# Patient Record
Sex: Female | Born: 1960 | Race: Black or African American | Hispanic: No | Marital: Married
Health system: Southern US, Community
[De-identification: ages and names within clinical notes are randomized; demographics above are authoritative.]

## PROBLEM LIST (undated history)

## (undated) DIAGNOSIS — J309 Allergic rhinitis, unspecified: Secondary | ICD-10-CM

## (undated) DIAGNOSIS — E669 Obesity, unspecified: Secondary | ICD-10-CM

## (undated) DIAGNOSIS — I10 Essential (primary) hypertension: Secondary | ICD-10-CM

## (undated) HISTORY — DX: Allergic rhinitis, unspecified: J30.9

## (undated) HISTORY — DX: Essential (primary) hypertension: I10

## (undated) HISTORY — DX: Obesity, unspecified: E66.9

---

## 1998-04-17 ENCOUNTER — Other Ambulatory Visit: Admission: RE | Admit: 1998-04-17 | Discharge: 1998-04-17 | Payer: Self-pay | Admitting: Family Medicine

## 1999-01-19 ENCOUNTER — Emergency Department (HOSPITAL_COMMUNITY): Admission: EM | Admit: 1999-01-19 | Discharge: 1999-01-19 | Payer: Self-pay | Admitting: Emergency Medicine

## 1999-01-19 ENCOUNTER — Encounter: Payer: Self-pay | Admitting: Emergency Medicine

## 2001-10-11 HISTORY — PX: ECTOPIC PREGNANCY SURGERY: SHX613

## 2001-10-13 ENCOUNTER — Encounter: Payer: Self-pay | Admitting: Emergency Medicine

## 2001-10-13 ENCOUNTER — Emergency Department (HOSPITAL_COMMUNITY): Admission: EM | Admit: 2001-10-13 | Discharge: 2001-10-13 | Payer: Self-pay | Admitting: Emergency Medicine

## 2002-03-30 ENCOUNTER — Encounter (INDEPENDENT_AMBULATORY_CARE_PROVIDER_SITE_OTHER): Payer: Self-pay

## 2002-03-30 ENCOUNTER — Encounter: Payer: Self-pay | Admitting: *Deleted

## 2002-03-30 ENCOUNTER — Inpatient Hospital Stay (HOSPITAL_COMMUNITY): Admission: AD | Admit: 2002-03-30 | Discharge: 2002-04-01 | Payer: Self-pay | Admitting: *Deleted

## 2002-04-04 ENCOUNTER — Inpatient Hospital Stay (HOSPITAL_COMMUNITY): Admission: AD | Admit: 2002-04-04 | Discharge: 2002-04-04 | Payer: Self-pay | Admitting: *Deleted

## 2002-05-08 ENCOUNTER — Encounter: Admission: RE | Admit: 2002-05-08 | Discharge: 2002-05-08 | Payer: Self-pay | Admitting: *Deleted

## 2002-06-14 ENCOUNTER — Ambulatory Visit (HOSPITAL_COMMUNITY): Admission: RE | Admit: 2002-06-14 | Discharge: 2002-06-14 | Payer: Self-pay | Admitting: *Deleted

## 2002-07-03 ENCOUNTER — Encounter: Admission: RE | Admit: 2002-07-03 | Discharge: 2002-07-03 | Payer: Self-pay | Admitting: *Deleted

## 2002-07-17 ENCOUNTER — Encounter: Admission: RE | Admit: 2002-07-17 | Discharge: 2002-07-17 | Payer: Self-pay | Admitting: *Deleted

## 2002-07-31 ENCOUNTER — Encounter: Admission: RE | Admit: 2002-07-31 | Discharge: 2002-07-31 | Payer: Self-pay | Admitting: *Deleted

## 2002-11-23 ENCOUNTER — Encounter: Admission: RE | Admit: 2002-11-23 | Discharge: 2002-11-23 | Payer: Self-pay | Admitting: *Deleted

## 2003-02-15 ENCOUNTER — Encounter: Admission: RE | Admit: 2003-02-15 | Discharge: 2003-02-15 | Payer: Self-pay | Admitting: Family Medicine

## 2003-04-26 ENCOUNTER — Ambulatory Visit (HOSPITAL_COMMUNITY): Admission: RE | Admit: 2003-04-26 | Discharge: 2003-04-26 | Payer: Self-pay | Admitting: Family Medicine

## 2003-05-27 ENCOUNTER — Ambulatory Visit (HOSPITAL_COMMUNITY): Admission: RE | Admit: 2003-05-27 | Discharge: 2003-05-27 | Payer: Self-pay | Admitting: Family Medicine

## 2003-05-27 ENCOUNTER — Encounter: Payer: Self-pay | Admitting: Family Medicine

## 2004-05-01 ENCOUNTER — Ambulatory Visit (HOSPITAL_COMMUNITY): Admission: RE | Admit: 2004-05-01 | Discharge: 2004-05-01 | Payer: Self-pay | Admitting: Family Medicine

## 2004-06-29 ENCOUNTER — Ambulatory Visit: Payer: Self-pay | Admitting: *Deleted

## 2005-03-19 ENCOUNTER — Ambulatory Visit: Payer: Self-pay | Admitting: Family Medicine

## 2005-04-05 ENCOUNTER — Emergency Department (HOSPITAL_COMMUNITY): Admission: EM | Admit: 2005-04-05 | Discharge: 2005-04-05 | Payer: Self-pay | Admitting: Emergency Medicine

## 2005-04-29 ENCOUNTER — Ambulatory Visit: Payer: Self-pay | Admitting: Family Medicine

## 2005-05-06 ENCOUNTER — Ambulatory Visit (HOSPITAL_COMMUNITY): Admission: RE | Admit: 2005-05-06 | Discharge: 2005-05-06 | Payer: Self-pay | Admitting: Family Medicine

## 2006-05-09 ENCOUNTER — Ambulatory Visit (HOSPITAL_COMMUNITY): Admission: RE | Admit: 2006-05-09 | Discharge: 2006-05-09 | Payer: Self-pay | Admitting: Family Medicine

## 2006-06-09 ENCOUNTER — Ambulatory Visit: Payer: Self-pay | Admitting: Family Medicine

## 2006-07-05 ENCOUNTER — Ambulatory Visit: Payer: Self-pay | Admitting: Family Medicine

## 2006-07-05 ENCOUNTER — Encounter (INDEPENDENT_AMBULATORY_CARE_PROVIDER_SITE_OTHER): Payer: Self-pay | Admitting: Family Medicine

## 2006-09-15 ENCOUNTER — Ambulatory Visit: Payer: Self-pay | Admitting: Family Medicine

## 2006-12-15 ENCOUNTER — Ambulatory Visit: Payer: Self-pay | Admitting: Family Medicine

## 2006-12-29 ENCOUNTER — Ambulatory Visit: Payer: Self-pay | Admitting: Family Medicine

## 2007-05-12 ENCOUNTER — Ambulatory Visit (HOSPITAL_COMMUNITY): Admission: RE | Admit: 2007-05-12 | Discharge: 2007-05-12 | Payer: Self-pay | Admitting: Internal Medicine

## 2007-06-28 ENCOUNTER — Encounter (INDEPENDENT_AMBULATORY_CARE_PROVIDER_SITE_OTHER): Payer: Self-pay | Admitting: *Deleted

## 2008-05-17 ENCOUNTER — Ambulatory Visit (HOSPITAL_COMMUNITY): Admission: RE | Admit: 2008-05-17 | Discharge: 2008-05-17 | Payer: Self-pay | Admitting: Internal Medicine

## 2009-05-23 ENCOUNTER — Ambulatory Visit (HOSPITAL_COMMUNITY): Admission: RE | Admit: 2009-05-23 | Discharge: 2009-05-23 | Payer: Self-pay | Admitting: Internal Medicine

## 2010-05-25 ENCOUNTER — Ambulatory Visit (HOSPITAL_COMMUNITY): Admission: RE | Admit: 2010-05-25 | Discharge: 2010-05-25 | Payer: Self-pay | Admitting: Internal Medicine

## 2011-02-26 NOTE — Op Note (Signed)
[facility]  Patient:    Sara Short, Sara Short Visit Number: [id number] MRN: [id number]          Service Type: GYN Location: [facility] Attending Physician:  Michaelle Copas Dictated by:   Conni Elliot, M.D. Admit Date:  04/04/2002 Discharge Date: 04/04/2002                             Operative Report  PREOPERATIVE DIAGNOSIS:       Right ectopic pregnancy.  POSTOPERATIVE DIAGNOSIS:      Right ectopic pregnancy, ruptured.  OPERATION:                    Exploratory laparotomy.  SURGEON:                      Conni Elliot, M.D.  ANESTHESIA:                   General endotracheal.  OPERATIVE FINDINGS:           The right fallopian tube was ruptured and was actively bleeding.  ESTIMATED BLOOD LOSS:         Approximately 400 cc.  DESCRIPTION OF PROCEDURE:     After placing the patient under general endotracheal anesthesia, abdomen was prepped and draped in a sterile fashion. A mini-laparotomy was performed in the midline suprapubic region.  The incision made in the skin and the subcutaneous fascia.  The peritoneal cavity entered.  The right fallopian tube was identified and brought into the operative field.  The ruptured site was identified and Kelly clamps were placed across it, and then after excising the ruptured portion of the tube, suture ligatures were utilized to achieve hemostasis.  Needle and sponge count were correct.  The anterior peritoneum, fascia, subcu, and skin were closed in routine fashion. Dictated by:   Conni Elliot, M.D. Attending Physician:  Michaelle Copas DD:  04/24/02 TD:  04/27/02 Job: [id number]

## 2011-02-26 NOTE — H&P (Signed)
NAME:  Sara Short, Sara Short                        ACCOUNT NO.:  [id number]   MEDICAL RECORD NO.:  [id number]                   PATIENT TYPE:  AMB   LOCATION:  [facility]                                  FACILITY:  [facility]   PHYSICIAN:  Mary Sella. Orlene Erm, M.D.                 DATE OF BIRTH:  Feb 12, 1961   DATE OF ADMISSION:  10/24/2002  DATE OF DISCHARGE:                                HISTORY & PHYSICAL   HISTORY OF PRESENT ILLNESS:  The patient is a 50 year old African-American  female G1 P0-1-0 who underwent salpingectomy for right ectopic pregnancy by  Dr. Gavin Potters several months ago.  The patient has been followed up  postoperatively and still desires fertility.  She has undergone a  hysterosalpingogram which revealed obstruction of her left fallopian tube as  well as findings consistent with her previous right salpingectomy.  The  patient had been counseled regarding her status and that in vitro  fertilization may be her best resource for pregnancy.  She does not have the  financial resources for this and still desires pregnancy.  The patient  strongly desires attempts at restoring fertility and would like to undergo  diagnostic laparoscopy with lysis of adhesions in an attempt to restore her  left tube to a normal condition.   PAST MEDICAL HISTORY:  None.   PAST SURGICAL HISTORY:  Right salpingectomy.   PAST OBSTETRICAL HISTORY:  As above.   PAST GYNECOLOGICAL HISTORY:  No history of STDs or abnormal Pap smears.   MEDICATIONS:  The patient is currently on Ortho Tri-Cyclen Low.   ALLERGIES:  No known drug allergies.   SOCIAL HISTORY:  She smokes approximately one-half pack per day of  cigarettes and denies alcohol or drug abuse.   FAMILY HISTORY:  Significant for father with diabetes mellitus.   PHYSICAL EXAMINATION:  VITAL SIGNS:  Pulse 70, blood pressure 119/62, weight  268.  HEENT:  Normocephalic, atraumatic.  HEART:  Regular rate and rhythm without murmurs, rubs, or gallops.  LUNGS:  Clear to auscultation bilaterally.  ABDOMEN:  Soft, nondistended, and slightly obese.  There is a well-healed  Pfannenstiel incision in the lower abdomen.  PELVIC:  EG/BUS is normal.  Bimanual exam reveals a nontender uterus.  It is  difficult to size secondary to the patient's habitus.  No adnexal masses are  palpable.   ASSESSMENT AND PLAN:  This is a 50 year old black female with infertility  and left tubal obstruction as well as history of right salpingectomy.  Additionally she has an ultrasound which reveals a uterine polyp versus  fibroid.  I discussed with the patient that if she desires to have  laparoscopy for infertility that she should undergo hysteroscopy for uterine  anomalies as well.  She agrees with that plan.  I have discussed with this  patient in detail the risks of surgery including the risk of bleeding,  infection, injury to internal organs requiring  laparotomy.  Further, I have  discussed with the patient her decreased chances of fertility based on her  age alone and increased risk for recurrent ectopic pregnancies or other  pregnancy-related issues related to her age.  Will schedule hysteroscopy,  diagnostic laparoscopy, and possible lysis of adhesions with  chromopertubation on October 24, 2002.                                               Mary Sella. Orlene Erm, M.D.    EM[facility]/MEDQ  D:  10/19/2002  T:  10/19/2002  Job:  [id number]   cc:   GYN Clinic   [facility] GYN office

## 2011-02-26 NOTE — H&P (Signed)
NAME:  Sara Short, BREECE NO.:  [id number]   MEDICAL RECORD NO.:  [id number]                   PATIENT TYPE:   LOCATION:                                       FACILITY:  [facility]   PHYSICIAN:  Mary Sella. Orlene Erm, M.D.                 DATE OF BIRTH:  [birth year]   DATE OF ADMISSION:  07/31/2002  DATE OF DISCHARGE:                                HISTORY & PHYSICAL   HISTORY OF PRESENT ILLNESS:  The patient is a 50 year old, African-American  female, G1, P0 who underwent salpingectomy for a right ectopic pregnancy by  Dr. Gavin Potters several months ago.  The patient had followed up postoperatively  and still desires pregnancy.  She underwent a hystersalpingography which  revealed obstruction of her left fallopian tube as well as findings  consistent with previous right salpingectomy.  The patient had been  counseled regarding her status and that in vetro fertilization may be her  best option for conception, however, the patient does not have the financial  resources for this.  The patient strongly desires attempts at restoring  fertility and would like to undergo laparoscopic lysis of adhesions and to  attempt to restore her left tube to normal condition.   PAST MEDICAL HISTORY:  None.   PAST SURGICAL HISTORY:  Right salpingectomy.   PAST OBSTETRICAL HISTORY:  As above.   PAST GYNECOLOGIC HISTORY:  No history of STDs or abnormal Pap smears.   MEDICATIONS:  Oral contraceptive pills.   ALLERGIES:  No known drug allergies.   SOCIAL HISTORY:  She is a 1/2 pack per day smoker.  She denies alcohol or  drug abuse.   FAMILY HISTORY:  Significant for father with diabetes mellitus.   PHYSICAL EXAMINATION:  HEENT:  Normocephalic, atraumatic.  HEART:  Regular rate and rhythm without murmur, rub or gallop.  LUNGS:  Clear to auscultation bilaterally.  ABDOMEN:  Soft, nondistended and slightly obese.  Well-healed Pfannenstiel  incision in the lower abdomen.  PELVIC:  EG/BUS  is normal.  Bimanual exam reveals a nontender uterus  difficult to size secondary to the patient's habitus.  No adnexal masses  palpable.   ASSESSMENT/PLAN:  A 50 year old, African-American female with infertility  and left tubal obstruction as well as history of right salpingectomy.  Her  history reveals a polyp versus fibroid in the uterine cavity.  We advised  the patient that if she desires to have laparoscopy for infertility she will  need hysteroscopy to evaluate the uterine anomalies as well.  She agrees  with that plan.  I further discussed with this patient her decreased chances  for fertility  based on her age alone and increased risk for pregnancy problems secondary  to her maternal age and the patient still wishes to proceed.  Will schedule  hysteroscopy, diagnostic laparoscopy and possible lysis of adhesions and  chromopertubation at the patient's earliest convenience.  Mary Sella. Orlene Erm, M.D.    EM[facility]/MEDQ  D:  07/31/2002  T:  07/31/2002  Job:  [id number]

## 2011-05-07 ENCOUNTER — Other Ambulatory Visit (HOSPITAL_COMMUNITY): Payer: Self-pay | Admitting: Internal Medicine

## 2011-05-07 DIAGNOSIS — Z1231 Encounter for screening mammogram for malignant neoplasm of breast: Secondary | ICD-10-CM

## 2011-05-28 ENCOUNTER — Ambulatory Visit (HOSPITAL_COMMUNITY)
Admission: RE | Admit: 2011-05-28 | Discharge: 2011-05-28 | Disposition: A | Discharge status: Home / Self Care | Payer: BC Managed Care – PPO | Source: Ambulatory Visit | Attending: Internal Medicine | Admitting: Internal Medicine

## 2011-05-28 DIAGNOSIS — Z1231 Encounter for screening mammogram for malignant neoplasm of breast: Secondary | ICD-10-CM

## 2012-05-16 ENCOUNTER — Other Ambulatory Visit (HOSPITAL_COMMUNITY): Payer: Self-pay | Admitting: Internal Medicine

## 2012-05-16 DIAGNOSIS — Z1231 Encounter for screening mammogram for malignant neoplasm of breast: Secondary | ICD-10-CM

## 2012-05-30 ENCOUNTER — Ambulatory Visit (HOSPITAL_COMMUNITY)
Admission: RE | Admit: 2012-05-30 | Discharge: 2012-05-30 | Disposition: A | Discharge status: Home / Self Care | Payer: BC Managed Care – PPO | Source: Ambulatory Visit | Attending: Internal Medicine | Admitting: Internal Medicine

## 2012-05-30 DIAGNOSIS — Z1231 Encounter for screening mammogram for malignant neoplasm of breast: Secondary | ICD-10-CM

## 2013-04-26 ENCOUNTER — Other Ambulatory Visit (HOSPITAL_COMMUNITY): Payer: Self-pay | Admitting: Internal Medicine

## 2013-04-26 DIAGNOSIS — Z1231 Encounter for screening mammogram for malignant neoplasm of breast: Secondary | ICD-10-CM

## 2013-06-01 ENCOUNTER — Ambulatory Visit (HOSPITAL_COMMUNITY)
Admission: RE | Admit: 2013-06-01 | Discharge: 2013-06-01 | Disposition: A | Discharge status: Home / Self Care | Payer: BC Managed Care – PPO | Source: Ambulatory Visit | Attending: Internal Medicine | Admitting: Internal Medicine

## 2013-06-01 ENCOUNTER — Other Ambulatory Visit (HOSPITAL_COMMUNITY): Payer: Self-pay | Admitting: Internal Medicine

## 2013-06-01 DIAGNOSIS — Z1231 Encounter for screening mammogram for malignant neoplasm of breast: Secondary | ICD-10-CM

## 2014-05-14 ENCOUNTER — Other Ambulatory Visit (HOSPITAL_COMMUNITY): Payer: Self-pay | Admitting: Internal Medicine

## 2014-05-14 DIAGNOSIS — Z1231 Encounter for screening mammogram for malignant neoplasm of breast: Secondary | ICD-10-CM

## 2014-06-14 ENCOUNTER — Ambulatory Visit (HOSPITAL_COMMUNITY)
Admission: RE | Admit: 2014-06-14 | Discharge: 2014-06-14 | Disposition: A | Discharge status: Home / Self Care | Payer: BC Managed Care – PPO | Source: Ambulatory Visit | Attending: Internal Medicine | Admitting: Internal Medicine

## 2014-06-14 ENCOUNTER — Other Ambulatory Visit (HOSPITAL_COMMUNITY): Payer: Self-pay | Admitting: Internal Medicine

## 2014-06-14 DIAGNOSIS — Z1231 Encounter for screening mammogram for malignant neoplasm of breast: Secondary | ICD-10-CM | POA: Diagnosis not present

## 2015-06-12 ENCOUNTER — Other Ambulatory Visit (HOSPITAL_COMMUNITY): Payer: Self-pay | Admitting: Internal Medicine

## 2015-06-12 DIAGNOSIS — Z1231 Encounter for screening mammogram for malignant neoplasm of breast: Secondary | ICD-10-CM

## 2015-06-23 ENCOUNTER — Ambulatory Visit (HOSPITAL_COMMUNITY)
Admission: RE | Admit: 2015-06-23 | Discharge: 2015-06-23 | Disposition: A | Discharge status: Home / Self Care | Payer: BC Managed Care – PPO | Source: Ambulatory Visit | Attending: Internal Medicine | Admitting: Internal Medicine

## 2015-06-23 DIAGNOSIS — Z1231 Encounter for screening mammogram for malignant neoplasm of breast: Secondary | ICD-10-CM | POA: Diagnosis not present

## 2016-05-25 ENCOUNTER — Other Ambulatory Visit: Payer: Self-pay | Admitting: Internal Medicine

## 2016-05-25 DIAGNOSIS — Z1231 Encounter for screening mammogram for malignant neoplasm of breast: Secondary | ICD-10-CM

## 2016-06-25 ENCOUNTER — Ambulatory Visit
Admission: RE | Admit: 2016-06-25 | Discharge: 2016-06-25 | Disposition: A | Discharge status: Home / Self Care | Payer: BC Managed Care – PPO | Source: Ambulatory Visit | Attending: Internal Medicine | Admitting: Internal Medicine

## 2016-06-25 DIAGNOSIS — Z1231 Encounter for screening mammogram for malignant neoplasm of breast: Secondary | ICD-10-CM

## 2017-05-17 ENCOUNTER — Other Ambulatory Visit: Payer: Self-pay | Admitting: Internal Medicine

## 2017-05-17 DIAGNOSIS — Z1231 Encounter for screening mammogram for malignant neoplasm of breast: Secondary | ICD-10-CM

## 2017-07-04 ENCOUNTER — Ambulatory Visit: Payer: BC Managed Care – PPO

## 2017-07-06 ENCOUNTER — Ambulatory Visit
Admission: RE | Admit: 2017-07-06 | Discharge: 2017-07-06 | Disposition: A | Discharge status: Home / Self Care | Payer: BC Managed Care – PPO | Source: Ambulatory Visit | Attending: Internal Medicine | Admitting: Internal Medicine

## 2017-07-06 DIAGNOSIS — Z1231 Encounter for screening mammogram for malignant neoplasm of breast: Secondary | ICD-10-CM

## 2018-06-05 ENCOUNTER — Other Ambulatory Visit: Payer: Self-pay | Admitting: Internal Medicine

## 2018-06-05 DIAGNOSIS — Z1231 Encounter for screening mammogram for malignant neoplasm of breast: Secondary | ICD-10-CM

## 2018-07-03 DIAGNOSIS — Z1211 Encounter for screening for malignant neoplasm of colon: Secondary | ICD-10-CM

## 2018-07-03 DIAGNOSIS — F17210 Nicotine dependence, cigarettes, uncomplicated: Secondary | ICD-10-CM

## 2018-07-03 DIAGNOSIS — Z0000 Encounter for general adult medical examination without abnormal findings: Secondary | ICD-10-CM

## 2018-07-03 DIAGNOSIS — Z1231 Encounter for screening mammogram for malignant neoplasm of breast: Secondary | ICD-10-CM | POA: Diagnosis not present

## 2018-07-11 ENCOUNTER — Other Ambulatory Visit: Payer: Self-pay | Admitting: Nurse Practitioner

## 2018-07-17 ENCOUNTER — Ambulatory Visit
Admission: RE | Admit: 2018-07-17 | Discharge: 2018-07-17 | Disposition: A | Discharge status: Home / Self Care | Payer: BC Managed Care – PPO | Source: Ambulatory Visit | Attending: Internal Medicine | Admitting: Internal Medicine

## 2018-07-17 DIAGNOSIS — Z1231 Encounter for screening mammogram for malignant neoplasm of breast: Secondary | ICD-10-CM

## 2018-07-28 ENCOUNTER — Other Ambulatory Visit: Payer: Self-pay | Admitting: Nurse Practitioner

## 2018-08-10 ENCOUNTER — Other Ambulatory Visit: Payer: Self-pay | Admitting: Nurse Practitioner

## 2018-10-09 ENCOUNTER — Other Ambulatory Visit: Payer: Self-pay | Admitting: Nurse Practitioner

## 2018-11-25 ENCOUNTER — Other Ambulatory Visit: Payer: Self-pay | Admitting: Nurse Practitioner

## 2019-01-02 ENCOUNTER — Ambulatory Visit: Payer: BC Managed Care – PPO | Admitting: Internal Medicine

## 2019-01-02 ENCOUNTER — Encounter: Payer: Self-pay | Admitting: Internal Medicine

## 2019-01-02 ENCOUNTER — Other Ambulatory Visit: Payer: Self-pay

## 2019-01-02 VITALS — BP 156/98 | HR 74 | Temp 99.3°F | Ht 69.0 in | Wt 240.8 lb

## 2019-01-02 DIAGNOSIS — I10 Essential (primary) hypertension: Secondary | ICD-10-CM

## 2019-01-02 DIAGNOSIS — R635 Abnormal weight gain: Secondary | ICD-10-CM | POA: Diagnosis not present

## 2019-01-02 DIAGNOSIS — R7309 Other abnormal glucose: Secondary | ICD-10-CM

## 2019-01-02 DIAGNOSIS — E6609 Other obesity due to excess calories: Secondary | ICD-10-CM

## 2019-01-02 DIAGNOSIS — Z6835 Body mass index (BMI) 35.0-35.9, adult: Secondary | ICD-10-CM

## 2019-01-02 DIAGNOSIS — F5101 Primary insomnia: Secondary | ICD-10-CM | POA: Diagnosis not present

## 2019-01-02 MED ORDER — MAGNESIUM 400 MG PO CAPS: 400.0000 mg | ORAL_CAPSULE | Freq: Every evening | ORAL | 1 refills | Status: AC

## 2019-01-02 NOTE — Progress Notes (Signed)
Subjective:     Patient ID: Sara Short , female    DOB: January 08, 1961 , 58 y.o.   MRN: [id number]   Chief Complaint  Patient presents with  . Hypertension    HPI  Hypertension  This is a chronic problem. The current episode started more than 1 year ago. The problem has been gradually improving since onset. The problem is uncontrolled. Pertinent negatives include no blurred vision, chest pain, palpitations or shortness of breath.     Past Medical History:  Diagnosis Date  . Allergic rhinitis   . HTN (hypertension)   . Obesity      Family History  Problem Relation Age of Onset  . Diverticulitis Mother   . Diabetes Father      Current Outpatient Medications:  .  Chlorpheniramine Maleate (ALLERGY PO), Take by mouth., Disp: , Rfl:  .  ISIBLOOM 0.15-30 MG-MCG tablet, TAKE 1 TABLET BY MOUTH EVERY DAY, Disp: 28 tablet, Rfl: 3 .  Multiple Vitamin (MULTIVITAMIN) capsule, Take 1 capsule by mouth daily., Disp: , Rfl:  .  simvastatin (ZOCOR) 20 MG tablet, TAKE 1 TABLET BY MOUTH AT BEDTIME, Disp: 90 tablet, Rfl: 1 .  telmisartan-hydrochlorothiazide (MICARDIS HCT) 40-12.5 MG tablet, TAKE 1 TABLET BY MOUTH EVERY DAY, Disp: 90 tablet, Rfl: 0 .  Magnesium 400 MG CAPS, Take 400 mg by mouth Nightly., Disp: 90 capsule, Rfl: 1   Not on File   Review of Systems  Constitutional: Negative.   Eyes: Negative for blurred vision.  Respiratory: Negative.  Negative for shortness of breath.   Cardiovascular: Negative.  Negative for chest pain and palpitations.  Gastrointestinal: Negative.   Neurological: Negative.   Psychiatric/Behavioral: Positive for sleep disturbance (she c/o difficulty falling asleep. works 3rd shift. ).     Today's Vitals   01/02/19 0912  BP: (!) 156/98  Pulse: 74  Temp: 99.3 F (37.4 C)  TempSrc: Oral  Weight: 240 lb 12.8 oz (109.2 kg)  Height: _0  (1.753 m)   Body mass index is 35.56 kg/m.   Objective:  Physical Exam Vitals signs and nursing note reviewed.   Constitutional:      Appearance: Normal appearance.  HENT:     Head: Normocephalic and atraumatic.  Cardiovascular:     Rate and Rhythm: Normal rate and regular rhythm.     Heart sounds: Normal heart sounds.  Pulmonary:     Effort: Pulmonary effort is normal.     Breath sounds: Normal breath sounds.  Skin:    General: Skin is warm.  Neurological:     General: No focal deficit present.     Mental Status: She is alert.  Psychiatric:        Mood and Affect: Mood normal.        Behavior: Behavior normal.         Assessment And Plan:     1. Essential hypertension, benign  Uncontrolled.  She admits not taking her meds today. She is encouraged to take meds as directed. She will rto in four weeks for re-evaluation.   - CMP14+EGFR - Lipid panel  2. Primary insomnia  Chronic. She is encouraged to start magnesium 472m once daily at bedtime. She will let me know if her symptoms persist.   3. Other abnormal glucose  HER A1C HAS BEEN ELEVATED IN THE PAST. I WILL CHECK AN A1C, BMET TODAY. SHE WAS ENCOURAGED TO AVOID SUGARY BEVERAGES AND PROCESSED FOODS INCLUDNG BREADS, RICE AND PASTA.  - Hemoglobin A1c  4. Weight gain  She is advised of 12 pound weight gain. She is encouraged to incorporate more exercise into her daily routine. She is also encouraged to avoid processed foods.   5. Class 2 obesity due to excess calories without serious comorbidity with body mass index (BMI) of 35.0 to 35.9 in adult  Importance of achieving optimal weight to decrease risk of cardiovascular disease and cancers was discussed with the patient in full detail. She is encouraged to start slowly - start with 10 minutes twice daily at least three to four days per week and to gradually build to 30 minutes five days weekly. She was given tips to incorporate more activity into her daily routine - take stairs when possible, park farther away from her job, grocery stores, etc.      Maximino Greenland, MD

## 2019-01-02 NOTE — Patient Instructions (Signed)
DASH Eating Plan DASH stands for "Dietary Approaches to Stop Hypertension." The DASH eating plan is a healthy eating plan that has been shown to reduce high blood pressure (hypertension). It may also reduce your risk for type 2 diabetes, heart disease, and stroke. The DASH eating plan may also help with weight loss. What are tips for following this plan?  General guidelines  Avoid eating more than 2,300 mg (milligrams) of salt (sodium) a day. If you have hypertension, you may need to reduce your sodium intake to 1,500 mg a day.  Limit alcohol intake to no more than 1 drink a day for nonpregnant women and 2 drinks a day for men. One drink equals 12 oz of beer, 5 oz of wine, or 1 oz of hard liquor.  Work with your health care provider to maintain a healthy body weight or to lose weight. Ask what an ideal weight is for you.  Get at least 30 minutes of exercise that causes your heart to beat faster (aerobic exercise) most days of the week. Activities may include walking, swimming, or biking.  Work with your health care provider or diet and nutrition specialist (dietitian) to adjust your eating plan to your individual calorie needs. Reading food labels   Check food labels for the amount of sodium per serving. Choose foods with less than 5 percent of the Daily Value of sodium. Generally, foods with less than 300 mg of sodium per serving fit into this eating plan.  To find whole grains, look for the word "whole" as the first word in the ingredient list. Shopping  Buy products labeled as "low-sodium" or "no salt added."  Buy fresh foods. Avoid canned foods and premade or frozen meals. Cooking  Avoid adding salt when cooking. Use salt-free seasonings or herbs instead of table salt or sea salt. Check with your health care provider or pharmacist before using salt substitutes.  Do not fry foods. Cook foods using healthy methods such as baking, boiling, grilling, and broiling instead.  Cook with  heart-healthy oils, such as olive, canola, soybean, or sunflower oil. Meal planning  Eat a balanced diet that includes: ? 5 or more servings of fruits and vegetables each day. At each meal, try to fill half of your plate with fruits and vegetables. ? Up to 6-8 servings of whole grains each day. ? Less than 6 oz of lean meat, poultry, or fish each day. A 3-oz serving of meat is about the same size as a deck of cards. One egg equals 1 oz. ? 2 servings of low-fat dairy each day. ? A serving of nuts, seeds, or beans 5 times each week. ? Heart-healthy fats. Healthy fats called Omega-3 fatty acids are found in foods such as flaxseeds and coldwater fish, like sardines, salmon, and mackerel.  Limit how much you eat of the following: ? Canned or prepackaged foods. ? Food that is high in trans fat, such as fried foods. ? Food that is high in saturated fat, such as fatty meat. ? Sweets, desserts, sugary drinks, and other foods with added sugar. ? Full-fat dairy products.  Do not salt foods before eating.  Try to eat at least 2 vegetarian meals each week.  Eat more home-cooked food and less restaurant, buffet, and fast food.  When eating at a restaurant, ask that your food be prepared with less salt or no salt, if possible. What foods are recommended? The items listed may not be a complete list. Talk with your dietitian about   what dietary choices are best for you. Grains Whole-grain or whole-wheat bread. Whole-grain or whole-wheat pasta. Brown rice. Oatmeal. Quinoa. Bulgur. Whole-grain and low-sodium cereals. Pita bread. Low-fat, low-sodium crackers. Whole-wheat flour tortillas. Vegetables Fresh or frozen vegetables (raw, steamed, roasted, or grilled). Low-sodium or reduced-sodium tomato and vegetable juice. Low-sodium or reduced-sodium tomato sauce and tomato paste. Low-sodium or reduced-sodium canned vegetables. Fruits All fresh, dried, or frozen fruit. Canned fruit in natural juice (without  added sugar). Meat and other protein foods Skinless chicken or turkey. Ground chicken or turkey. Pork with fat trimmed off. Fish and seafood. Egg whites. Dried beans, peas, or lentils. Unsalted nuts, nut butters, and seeds. Unsalted canned beans. Lean cuts of beef with fat trimmed off. Low-sodium, lean deli meat. Dairy Low-fat (1%) or fat-free (skim) milk. Fat-free, low-fat, or reduced-fat cheeses. Nonfat, low-sodium ricotta or cottage cheese. Low-fat or nonfat yogurt. Low-fat, low-sodium cheese. Fats and oils Soft margarine without trans fats. Vegetable oil. Low-fat, reduced-fat, or light mayonnaise and salad dressings (reduced-sodium). Canola, safflower, olive, soybean, and sunflower oils. Avocado. Seasoning and other foods Herbs. Spices. Seasoning mixes without salt. Unsalted popcorn and pretzels. Fat-free sweets. What foods are not recommended? The items listed may not be a complete list. Talk with your dietitian about what dietary choices are best for you. Grains Baked goods made with fat, such as croissants, muffins, or some breads. Dry pasta or rice meal packs. Vegetables Creamed or fried vegetables. Vegetables in a cheese sauce. Regular canned vegetables (not low-sodium or reduced-sodium). Regular canned tomato sauce and paste (not low-sodium or reduced-sodium). Regular tomato and vegetable juice (not low-sodium or reduced-sodium). Pickles. Olives. Fruits Canned fruit in a light or heavy syrup. Fried fruit. Fruit in cream or butter sauce. Meat and other protein foods Fatty cuts of meat. Ribs. Fried meat. Bacon. Sausage. Bologna and other processed lunch meats. Salami. Fatback. Hotdogs. Bratwurst. Salted nuts and seeds. Canned beans with added salt. Canned or smoked fish. Whole eggs or egg yolks. Chicken or turkey with skin. Dairy Whole or 2% milk, cream, and half-and-half. Whole or full-fat cream cheese. Whole-fat or sweetened yogurt. Full-fat cheese. Nondairy creamers. Whipped toppings.  Processed cheese and cheese spreads. Fats and oils Butter. Stick margarine. Lard. Shortening. Ghee. Bacon fat. Tropical oils, such as coconut, palm kernel, or palm oil. Seasoning and other foods Salted popcorn and pretzels. Onion salt, garlic salt, seasoned salt, table salt, and sea salt. Worcestershire sauce. Tartar sauce. Barbecue sauce. Teriyaki sauce. Soy sauce, including reduced-sodium. Steak sauce. Canned and packaged gravies. Fish sauce. Oyster sauce. Cocktail sauce. Horseradish that you find on the shelf. Ketchup. Mustard. Meat flavorings and tenderizers. Bouillon cubes. Hot sauce and Tabasco sauce. Premade or packaged marinades. Premade or packaged taco seasonings. Relishes. Regular salad dressings. Where to find more information:  National Heart, Lung, and Blood Institute: www.nhlbi.nih.gov  American Heart Association: www.heart.org Summary  The DASH eating plan is a healthy eating plan that has been shown to reduce high blood pressure (hypertension). It may also reduce your risk for type 2 diabetes, heart disease, and stroke.  With the DASH eating plan, you should limit salt (sodium) intake to 2,300 mg a day. If you have hypertension, you may need to reduce your sodium intake to 1,500 mg a day.  When on the DASH eating plan, aim to eat more fresh fruits and vegetables, whole grains, lean proteins, low-fat dairy, and heart-healthy fats.  Work with your health care provider or diet and nutrition specialist (dietitian) to adjust your eating plan to your   individual calorie needs. This information is not intended to replace advice given to you by your health care provider. Make sure you discuss any questions you have with your health care provider. Document Released: 09/16/2011 Document Revised: 09/20/2016 Document Reviewed: 09/20/2016 Elsevier Interactive Patient Education  2019 [employer]   Insomnia Insomnia is a sleep disorder that makes it difficult to fall asleep or stay  asleep. Insomnia can cause fatigue, low energy, difficulty concentrating, mood swings, and poor performance at work or school. There are three different ways to classify insomnia:  Difficulty falling asleep.  Difficulty staying asleep.  Waking up too early in the morning. Any type of insomnia can be long-term (chronic) or short-term (acute). Both are common. Short-term insomnia usually lasts for three months or less. Chronic insomnia occurs at least three times a week for longer than three months. What are the causes? Insomnia may be caused by another condition, situation, or substance, such as:  Anxiety.  Certain medicines.  Gastroesophageal reflux disease (GERD) or other gastrointestinal conditions.  Asthma or other breathing conditions.  Restless legs syndrome, sleep apnea, or other sleep disorders.  Chronic pain.  Menopause.  Stroke.  Abuse of alcohol, tobacco, or illegal drugs.  Mental health conditions, such as depression.  Caffeine.  Neurological disorders, such as Alzheimer's disease.  An overactive thyroid (hyperthyroidism). Sometimes, the cause of insomnia may not be known. What increases the risk? Risk factors for insomnia include:  Gender. Women are affected more often than men.  Age. Insomnia is more common as you get older.  Stress.  Lack of exercise.  Irregular work schedule or working night shifts.  Traveling between different time zones.  Certain medical and mental health conditions. What are the signs or symptoms? If you have insomnia, the main symptom is having trouble falling asleep or having trouble staying asleep. This may lead to other symptoms, such as:  Feeling fatigued or having low energy.  Feeling nervous about going to sleep.  Not feeling rested in the morning.  Having trouble concentrating.  Feeling irritable, anxious, or depressed. How is this diagnosed? This condition may be diagnosed based on:  Your symptoms and  medical history. Your health care provider may ask about: ? Your sleep habits. ? Any medical conditions you have. ? Your mental health.  A physical exam. How is this treated? Treatment for insomnia depends on the cause. Treatment may focus on treating an underlying condition that is causing insomnia. Treatment may also include:  Medicines to help you sleep.  Counseling or therapy.  Lifestyle adjustments to help you sleep better. Follow these instructions at home: Eating and drinking   Limit or avoid alcohol, caffeinated beverages, and cigarettes, especially close to bedtime. These can disrupt your sleep.  Do not eat a large meal or eat spicy foods right before bedtime. This can lead to digestive discomfort that can make it hard for you to sleep. Sleep habits   Keep a sleep diary to help you and your health care provider figure out what could be causing your insomnia. Write down: ? When you sleep. ? When you wake up during the night. ? How well you sleep. ? How rested you feel the next day. ? Any side effects of medicines you are taking. ? What you eat and drink.  Make your bedroom a dark, comfortable place where it is easy to fall asleep. ? Put up shades or blackout curtains to block light from outside. ? Use a white noise machine to block noise. ?  Keep the temperature cool.  Limit screen use before bedtime. This includes: ? Watching TV. ? Using your smartphone, tablet, or computer.  Stick to a routine that includes going to bed and waking up at the same times every day and night. This can help you fall asleep faster. Consider making a quiet activity, such as reading, part of your nighttime routine.  Try to avoid taking naps during the day so that you sleep better at night.  Get out of bed if you are still awake after 15 minutes of trying to sleep. Keep the lights down, but try reading or doing a quiet activity. When you feel sleepy, go back to bed. General instructions   Take over-the-counter and prescription medicines only as told by your health care provider.  Exercise regularly, as told by your health care provider. Avoid exercise starting several hours before bedtime.  Use relaxation techniques to manage stress. Ask your health care provider to suggest some techniques that may work well for you. These may include: ? Breathing exercises. ? Routines to release muscle tension. ? Visualizing peaceful scenes.  Make sure that you drive carefully. Avoid driving if you feel very sleepy.  Keep all follow-up visits as told by your health care provider. This is important. Contact a health care provider if:  You are tired throughout the day.  You have trouble in your daily routine due to sleepiness.  You continue to have sleep problems, or your sleep problems get worse. Get help right away if:  You have serious thoughts about hurting yourself or someone else. If you ever feel like you may hurt yourself or others, or have thoughts about taking your own life, get help right away. You can go to your nearest emergency department or call:  Your local emergency services (911 in the [place]  A suicide crisis helpline, such as the National Suicide Prevention Lifeline at (226) 812-0821. This is open 24 hours a day. Summary  Insomnia is a sleep disorder that makes it difficult to fall asleep or stay asleep.  Insomnia can be long-term (chronic) or short-term (acute).  Treatment for insomnia depends on the cause. Treatment may focus on treating an underlying condition that is causing insomnia.  Keep a sleep diary to help you and your health care provider figure out what could be causing your insomnia. This information is not intended to replace advice given to you by your health care provider. Make sure you discuss any questions you have with your health care provider. Document Released: 09/24/2000 Document Revised: 07/07/2017 Document Reviewed: 07/07/2017  Elsevier Interactive Patient Education  2019 [employer].

## 2019-01-03 LAB — CMP14+EGFR
ALBUMIN: 3.7 g/dL — AB (ref 3.8–4.9)
ALT: 15 IU/L (ref 0–32)
AST: 17 IU/L (ref 0–40)
Albumin/Globulin Ratio: 1.3 (ref 1.2–2.2)
Alkaline Phosphatase: 53 IU/L (ref 39–117)
BUN / CREAT RATIO: 21 (ref 9–23)
BUN: 15 mg/dL (ref 6–24)
CHLORIDE: 104 mmol/L (ref 96–106)
CO2: 24 mmol/L (ref 20–29)
CREATININE: 0.73 mg/dL (ref 0.57–1.00)
Calcium: 8.9 mg/dL (ref 8.7–10.2)
GFR calc non Af Amer: 92 mL/min/{1.73_m2} (ref >59)
GFR, EST AFRICAN AMERICAN: 106 mL/min/{1.73_m2} (ref >59)
GLUCOSE: 88 mg/dL (ref 65–99)
Globulin, Total: 2.9 g/dL (ref 1.5–4.5)
Potassium: 4.1 mmol/L (ref 3.5–5.2)
Sodium: 141 mmol/L (ref 134–144)
TOTAL PROTEIN: 6.6 g/dL (ref 6.0–8.5)

## 2019-01-03 LAB — LIPID PANEL
CHOLESTEROL TOTAL: 154 mg/dL (ref 100–199)
Chol/HDL Ratio: 2.9 ratio (ref 0.0–4.4)
HDL: 53 mg/dL (ref >39)
LDL Calculated: 85 mg/dL (ref 0–99)
Triglycerides: 82 mg/dL (ref 0–149)
VLDL Cholesterol Cal: 16 mg/dL (ref 5–40)

## 2019-01-03 LAB — HEMOGLOBIN A1C
Est. average glucose Bld gHb Est-mCnc: 114 mg/dL
Hgb A1c MFr Bld: 5.6 % (ref 4.8–5.6)

## 2019-01-04 ENCOUNTER — Other Ambulatory Visit: Payer: Self-pay | Admitting: Internal Medicine

## 2019-02-01 ENCOUNTER — Encounter: Payer: Self-pay | Admitting: Internal Medicine

## 2019-02-01 ENCOUNTER — Ambulatory Visit: Payer: BC Managed Care – PPO | Admitting: Internal Medicine

## 2019-02-01 ENCOUNTER — Other Ambulatory Visit: Payer: Self-pay

## 2019-02-01 VITALS — BP 138/90 | HR 77 | Temp 98.8°F | Ht 69.0 in | Wt 239.0 lb

## 2019-02-01 DIAGNOSIS — E6609 Other obesity due to excess calories: Secondary | ICD-10-CM

## 2019-02-01 DIAGNOSIS — Z6835 Body mass index (BMI) 35.0-35.9, adult: Secondary | ICD-10-CM | POA: Diagnosis not present

## 2019-02-01 DIAGNOSIS — I10 Essential (primary) hypertension: Secondary | ICD-10-CM | POA: Diagnosis not present

## 2019-02-01 NOTE — Patient Instructions (Signed)

## 2019-02-01 NOTE — Progress Notes (Signed)
Subjective:     Patient ID: Sara Short , female    DOB: December 10, 1960 , 58 y.o.   MRN: [id number]   Chief Complaint  Patient presents with  . Hypertension    HPI  She is here today for bp check. BP was elevated at her last visit. She reports that she has cut back on her eating. However, she admits that she is not moving as much. She has a new position at her job, and she is not as active. She has no new concerns.     Past Medical History:  Diagnosis Date  . Allergic rhinitis   . HTN (hypertension)   . Obesity      Family History  Problem Relation Age of Onset  . Diverticulitis Mother   . Diabetes Father      Current Outpatient Medications:  .  Chlorpheniramine Maleate (ALLERGY PO), Take by mouth., Disp: , Rfl:  .  Cholecalciferol (VITAMIN D3) 25 MCG (1000 UT) CAPS, Take 1 capsule by mouth. 1 per day, Disp: , Rfl:  .  ISIBLOOM 0.15-30 MG-MCG tablet, TAKE 1 TABLET BY MOUTH EVERY DAY, Disp: 28 tablet, Rfl: 3 .  Magnesium 400 MG CAPS, Take 400 mg by mouth Nightly., Disp: 90 capsule, Rfl: 1 .  Multiple Vitamin (MULTIVITAMIN) capsule, Take 1 capsule by mouth daily., Disp: , Rfl:  .  simvastatin (ZOCOR) 20 MG tablet, TAKE 1 TABLET BY MOUTH AT BEDTIME, Disp: 90 tablet, Rfl: 1 .  telmisartan-hydrochlorothiazide (MICARDIS HCT) 40-12.5 MG tablet, TAKE 1 TABLET BY MOUTH EVERY DAY, Disp: 90 tablet, Rfl: 0   Not on File   Review of Systems  Constitutional: Negative.   Respiratory: Negative.   Cardiovascular: Negative.   Gastrointestinal: Negative.   Neurological: Negative.   Psychiatric/Behavioral: Negative.      Today's Vitals   02/01/19 0911  BP: 138/90  Pulse: 77  Temp: 98.8 F (37.1 C)  TempSrc: Oral  Weight: 239 lb (108.4 kg)  Height: 5\' 9"  (1.753 m)   Body mass index is 35.29 kg/m.   Objective:  Physical Exam Vitals signs and nursing note reviewed.  Constitutional:      Appearance: Normal appearance.  HENT:     Head: Normocephalic and atraumatic.   Cardiovascular:     Rate and Rhythm: Normal rate and regular rhythm.     Heart sounds: Normal heart sounds.  Pulmonary:     Effort: Pulmonary effort is normal.     Breath sounds: Normal breath sounds.  Skin:    General: Skin is warm.  Neurological:     General: No focal deficit present.     Mental Status: She is alert.  Psychiatric:        Mood and Affect: Mood normal.        Behavior: Behavior normal.         Assessment And Plan:     1. Essential hypertension, benign  Improved control, not yet at goal. She will continue with current meds for now. She is encouraged to incorporate more exercise into her daily routine. She is encouraged to start with walking 30 minutes five days per week. She will rto in 3-4 months for re-evaluation.   2. Class 2 obesity due to excess calories without serious comorbidity with body mass index (BMI) of 35.0 to 35.9 in adult  She is encouraged to lose ten pounds prior to her next visit. Again, importance of regular exercise was discussed with the patient.   Gwynneth Aliment, MD  THE PATIENT IS ENCOURAGED TO PRACTICE SOCIAL DISTANCING DUE TO THE COVID-19 PANDEMIC.   

## 2019-03-02 ENCOUNTER — Other Ambulatory Visit: Payer: Self-pay | Admitting: Nurse Practitioner

## 2019-03-11 ENCOUNTER — Other Ambulatory Visit: Payer: Self-pay | Admitting: Nurse Practitioner

## 2019-04-02 ENCOUNTER — Other Ambulatory Visit: Payer: Self-pay | Admitting: Internal Medicine

## 2019-04-12 ENCOUNTER — Other Ambulatory Visit: Payer: Self-pay | Admitting: Internal Medicine

## 2019-05-07 ENCOUNTER — Encounter: Payer: Self-pay | Admitting: Internal Medicine

## 2019-05-07 ENCOUNTER — Other Ambulatory Visit: Payer: Self-pay

## 2019-05-07 ENCOUNTER — Ambulatory Visit: Payer: BC Managed Care – PPO | Admitting: Internal Medicine

## 2019-05-07 VITALS — BP 124/86 | HR 72 | Temp 98.8°F | Ht 69.0 in | Wt 243.2 lb

## 2019-05-07 DIAGNOSIS — Z6835 Body mass index (BMI) 35.0-35.9, adult: Secondary | ICD-10-CM

## 2019-05-07 DIAGNOSIS — Z1211 Encounter for screening for malignant neoplasm of colon: Secondary | ICD-10-CM

## 2019-05-07 DIAGNOSIS — I10 Essential (primary) hypertension: Secondary | ICD-10-CM | POA: Diagnosis not present

## 2019-05-07 DIAGNOSIS — Z23 Encounter for immunization: Secondary | ICD-10-CM

## 2019-05-07 DIAGNOSIS — E66812 Obesity, class 2: Secondary | ICD-10-CM

## 2019-05-07 DIAGNOSIS — E6609 Other obesity due to excess calories: Secondary | ICD-10-CM

## 2019-05-07 MED ORDER — TETANUS-DIPHTH-ACELL PERTUSSIS 5-2.5-18.5 LF-MCG/0.5 IM SUSP
0.5000 mL | Freq: Once | INTRAMUSCULAR | Status: AC
  Administered 2019-05-07: 0.5 mL via INTRAMUSCULAR

## 2019-05-07 NOTE — Patient Instructions (Signed)

## 2019-05-07 NOTE — Progress Notes (Signed)
Subjective:     Patient ID: Sara Short , female    DOB: 1961-07-02 , 58 y.o.   MRN: [id number]   Chief Complaint  Patient presents with  . Hypertension    HPI  Hypertension This is a chronic problem. The current episode started more than 1 year ago. The problem has been gradually improving since onset. The problem is controlled. Pertinent negatives include no blurred vision, chest pain, palpitations or shortness of breath. Risk factors for coronary artery disease include obesity, sedentary lifestyle and post-menopausal state. The current treatment provides moderate improvement.     Past Medical History:  Diagnosis Date  . Allergic rhinitis   . HTN (hypertension)   . Obesity      Family History  Problem Relation Age of Onset  . Diverticulitis Mother   . Hypertension Mother   . Diabetes Father      Current Outpatient Medications:  .  Chlorpheniramine Maleate (ALLERGY PO), Take by mouth., Disp: , Rfl:  .  Cholecalciferol (VITAMIN D3) 25 MCG (1000 UT) CAPS, Take 1 capsule by mouth. 1 per day, Disp: , Rfl:  .  ISIBLOOM 0.15-30 MG-MCG tablet, TAKE 1 TABLET BY MOUTH EVERY DAY, Disp: 28 tablet, Rfl: 0 .  Magnesium 400 MG CAPS, Take 400 mg by mouth Nightly., Disp: 90 capsule, Rfl: 1 .  Multiple Vitamin (MULTIVITAMIN) capsule, Take 1 capsule by mouth daily., Disp: , Rfl:  .  simvastatin (ZOCOR) 20 MG tablet, TAKE 1 TABLET BY MOUTH AT BEDTIME, Disp: 90 tablet, Rfl: 1 .  telmisartan-hydrochlorothiazide (MICARDIS HCT) 40-12.5 MG tablet, TAKE 1 TABLET BY MOUTH EVERY DAY, Disp: 90 tablet, Rfl: 0   Allergies  Allergen Reactions  . Crestor [Rosuvastatin Calcium] Hives  . Lipitor [Atorvastatin Calcium] Hives     Review of Systems  Constitutional: Negative.   Eyes: Negative for blurred vision.  Respiratory: Negative.  Negative for shortness of breath.   Cardiovascular: Negative.  Negative for chest pain and palpitations.  Gastrointestinal: Negative.   Neurological: Negative.    Psychiatric/Behavioral: Negative.      Today's Vitals   05/07/19 1058  BP: 124/86  Pulse: 72  Temp: 98.8 F (37.1 C)  TempSrc: Oral  Weight: 243 lb 3.2 oz (110.3 kg)  Height: 5\' 9"  (1.753 m)   Body mass index is 35.91 kg/m.   Objective:  Physical Exam Vitals signs and nursing note reviewed.  Constitutional:      Appearance: Normal appearance.  HENT:     Head: Normocephalic and atraumatic.  Cardiovascular:     Rate and Rhythm: Normal rate and regular rhythm.     Heart sounds: Normal heart sounds.  Pulmonary:     Effort: Pulmonary effort is normal.     Breath sounds: Normal breath sounds.  Skin:    General: Skin is warm.  Neurological:     General: No focal deficit present.     Mental Status: She is alert.  Psychiatric:        Mood and Affect: Mood normal.        Behavior: Behavior normal.         Assessment And Plan:     1. Essential hypertension, benign  Chronic, well controlled.  She will continue with current meds. She is encouraged to avoid adding salt to her foods and to increase her daily activity.   - Hepatitis C antibody  2. Class 2 obesity due to excess calories without serious comorbidity with body mass index (BMI) of 35.0 to 35.9 in adult  Importance of achieving optimal weight to decrease risk of cardiovascular disease and cancers was discussed with the patient in full detail. She is encouraged to start slowly - start with 10 minutes twice daily at least three to four days per week and to gradually build to 30 minutes five days weekly. She was given tips to incorporate more activity into her daily routine - take stairs when possible, park farther away from her job, grocery stores, etc.    3. Colon cancer screening  I will refer her to GI for CRC screening. [facility]  - Ambulatory referral to Gastroenterology  4. Need for vaccination  - Tdap (BOOSTRIX) injection 0.5 mL        Gwynneth Alimentobyn N Annamay Laymon, MD     THE PATIENT IS ENCOURAGED TO PRACTICE SOCIAL DISTANCING DUE TO THE COVID-19 PANDEMIC.

## 2019-05-08 LAB — HEPATITIS C ANTIBODY: Hep C Virus Ab: <0.1 s/co ratio (ref 0.0–0.9)

## 2019-05-09 ENCOUNTER — Other Ambulatory Visit: Payer: Self-pay | Admitting: Internal Medicine

## 2019-06-05 ENCOUNTER — Other Ambulatory Visit: Payer: Self-pay | Admitting: Internal Medicine

## 2019-06-19 ENCOUNTER — Other Ambulatory Visit: Payer: Self-pay | Admitting: Internal Medicine

## 2019-06-19 DIAGNOSIS — Z1231 Encounter for screening mammogram for malignant neoplasm of breast: Secondary | ICD-10-CM

## 2019-07-03 ENCOUNTER — Other Ambulatory Visit: Payer: Self-pay

## 2019-07-03 MED ORDER — TELMISARTAN-HCTZ 40-12.5 MG PO TABS: 1.0000 | ORAL_TABLET | Freq: Every day | ORAL | 0 refills | Status: DC

## 2019-07-03 MED ORDER — DESOGESTREL-ETHINYL ESTRADIOL 0.15-30 MG-MCG PO TABS: 1.0000 | ORAL_TABLET | Freq: Every day | ORAL | 0 refills | Status: DC

## 2019-07-23 ENCOUNTER — Encounter: Payer: BC Managed Care – PPO | Admitting: Internal Medicine

## 2019-07-31 ENCOUNTER — Ambulatory Visit
Admission: RE | Admit: 2019-07-31 | Discharge: 2019-07-31 | Disposition: A | Discharge status: Home / Self Care | Payer: BC Managed Care – PPO | Source: Ambulatory Visit | Attending: Internal Medicine | Admitting: Internal Medicine

## 2019-07-31 ENCOUNTER — Other Ambulatory Visit: Payer: Self-pay

## 2019-07-31 DIAGNOSIS — Z1231 Encounter for screening mammogram for malignant neoplasm of breast: Secondary | ICD-10-CM

## 2019-09-05 ENCOUNTER — Encounter: Payer: Self-pay | Admitting: Internal Medicine

## 2019-09-24 ENCOUNTER — Encounter: Payer: Self-pay | Admitting: Internal Medicine

## 2019-09-24 ENCOUNTER — Ambulatory Visit: Payer: BC Managed Care – PPO | Admitting: Internal Medicine

## 2019-09-24 ENCOUNTER — Other Ambulatory Visit: Payer: Self-pay | Admitting: Internal Medicine

## 2019-09-24 ENCOUNTER — Other Ambulatory Visit: Payer: Self-pay

## 2019-09-24 VITALS — BP 142/88 | HR 84 | Temp 98.8°F | Ht 67.0 in | Wt 240.8 lb

## 2019-09-24 DIAGNOSIS — E049 Nontoxic goiter, unspecified: Secondary | ICD-10-CM | POA: Diagnosis not present

## 2019-09-24 DIAGNOSIS — I10 Essential (primary) hypertension: Secondary | ICD-10-CM | POA: Diagnosis not present

## 2019-09-24 DIAGNOSIS — Z0000 Encounter for general adult medical examination without abnormal findings: Secondary | ICD-10-CM | POA: Diagnosis not present

## 2019-09-24 LAB — POCT URINALYSIS DIPSTICK
Bilirubin, UA: NEGATIVE
Glucose, UA: NEGATIVE
Ketones, UA: NEGATIVE
Leukocytes, UA: NEGATIVE
Nitrite, UA: NEGATIVE
Protein, UA: POSITIVE — AB
Spec Grav, UA: 1.02 (ref 1.010–1.025)
Urobilinogen, UA: 0.2 E.U./dL
pH, UA: 5.5 (ref 5.0–8.0)

## 2019-09-24 LAB — POCT UA - MICROALBUMIN
Albumin/Creatinine Ratio, Urine, POC: <30
Creatinine, POC: 100 mg/dL
Microalbumin Ur, POC: 30 mg/L

## 2019-09-24 MED ORDER — TELMISARTAN-HCTZ 40-12.5 MG PO TABS: 1.0000 | ORAL_TABLET | Freq: Every day | ORAL | 2 refills | Status: DC

## 2019-09-24 MED ORDER — SIMVASTATIN 20 MG PO TABS: 20.0000 mg | ORAL_TABLET | Freq: Every day | ORAL | 2 refills | Status: DC

## 2019-09-24 NOTE — Patient Instructions (Signed)
Health Maintenance, Female Adopting a healthy lifestyle and getting preventive care are important in promoting health and wellness. Ask your health care provider about:  The right schedule for you to have regular tests and exams.  Things you can do on your own to prevent diseases and keep yourself healthy. What should I know about diet, weight, and exercise? Eat a healthy diet   Eat a diet that includes plenty of vegetables, fruits, low-fat dairy products, and lean protein.  Do not eat a lot of foods that are high in solid fats, added sugars, or sodium. Maintain a healthy weight Body mass index (BMI) is used to identify weight problems. It estimates body fat based on height and weight. Your health care provider can help determine your BMI and help you achieve or maintain a healthy weight. Get regular exercise Get regular exercise. This is one of the most important things you can do for your health. Most adults should:  Exercise for at least 150 minutes each week. The exercise should increase your heart rate and make you sweat (moderate-intensity exercise).  Do strengthening exercises at least twice a week. This is in addition to the moderate-intensity exercise.  Spend less time sitting. Even light physical activity can be beneficial. Watch cholesterol and blood lipids Have your blood tested for lipids and cholesterol at 57 years of age, then have this test every 5 years. Have your cholesterol levels checked more often if:  Your lipid or cholesterol levels are high.  You are older than 57 years of age.  You are at high risk for heart disease. What should I know about cancer screening? Depending on your health history and family history, you may need to have cancer screening at various ages. This may include screening for:  Breast cancer.  Cervical cancer.  Colorectal cancer.  Skin cancer.  Lung cancer. What should I know about heart disease, diabetes, and high blood  pressure? Blood pressure and heart disease  High blood pressure causes heart disease and increases the risk of stroke. This is more likely to develop in people who have high blood pressure readings, are of African descent, or are overweight.  Have your blood pressure checked: ? Every 3-5 years if you are 18-39 years of age. ? Every year if you are 40 years old or older. Diabetes Have regular diabetes screenings. This checks your fasting blood sugar level. Have the screening done:  Once every three years after age 40 if you are at a normal weight and have a low risk for diabetes.  More often and at a younger age if you are overweight or have a high risk for diabetes. What should I know about preventing infection? Hepatitis B If you have a higher risk for hepatitis B, you should be screened for this virus. Talk with your health care provider to find out if you are at risk for hepatitis B infection. Hepatitis C Testing is recommended for:  Everyone born from 1945 through 1965.  Anyone with known risk factors for hepatitis C. Sexually transmitted infections (STIs)  Get screened for STIs, including gonorrhea and chlamydia, if: ? You are sexually active and are younger than 57 years of age. ? You are older than 57 years of age and your health care provider tells you that you are at risk for this type of infection. ? Your sexual activity has changed since you were last screened, and you are at increased risk for chlamydia or gonorrhea. Ask your health care provider if   you are at risk.  Ask your health care provider about whether you are at high risk for HIV. Your health care provider may recommend a prescription medicine to help prevent HIV infection. If you choose to take medicine to prevent HIV, you should first get tested for HIV. You should then be tested every 3 months for as long as you are taking the medicine. Pregnancy  If you are about to stop having your period (premenopausal) and  you may become pregnant, seek counseling before you get pregnant.  Take 400 to 800 micrograms (mcg) of folic acid every day if you become pregnant.  Ask for birth control (contraception) if you want to prevent pregnancy. Osteoporosis and menopause Osteoporosis is a disease in which the bones lose minerals and strength with aging. This can result in bone fractures. If you are 65 years old or older, or if you are at risk for osteoporosis and fractures, ask your health care provider if you should:  Be screened for bone loss.  Take a calcium or vitamin D supplement to lower your risk of fractures.  Be given hormone replacement therapy (HRT) to treat symptoms of menopause. Follow these instructions at home: Lifestyle  Do not use any products that contain nicotine or tobacco, such as cigarettes, e-cigarettes, and chewing tobacco. If you need help quitting, ask your health care provider.  Do not use street drugs.  Do not share needles.  Ask your health care provider for help if you need support or information about quitting drugs. Alcohol use  Do not drink alcohol if: ? Your health care provider tells you not to drink. ? You are pregnant, may be pregnant, or are planning to become pregnant.  If you drink alcohol: ? Limit how much you use to 0-1 drink a day. ? Limit intake if you are breastfeeding.  Be aware of how much alcohol is in your drink. In the [place] one drink equals one 12 oz bottle of beer (355 mL), one 5 oz glass of wine (148 mL), or one 1 oz glass of hard liquor (44 mL). General instructions  Schedule regular health, dental, and eye exams.  Stay current with your vaccines.  Tell your health care provider if: ? You often feel depressed. ? You have ever been abused or do not feel safe at home. Summary  Adopting a healthy lifestyle and getting preventive care are important in promoting health and wellness.  Follow your health care provider's instructions about healthy  diet, exercising, and getting tested or screened for diseases.  Follow your health care provider's instructions on monitoring your cholesterol and blood pressure. This information is not intended to replace advice given to you by your health care provider. Make sure you discuss any questions you have with your health care provider. Document Released: 04/12/2011 Document Revised: 09/20/2018 Document Reviewed: 09/20/2018 Elsevier Patient Education  2020 [employer] Inc[employer]  

## 2019-09-24 NOTE — Progress Notes (Signed)
This visit occurred during the SARS-CoV-2 public health emergency.  Safety protocols were in place, including screening questions prior to the visit, additional usage of staff PPE, and extensive cleaning of exam room while observing appropriate contact time as indicated for disinfecting solutions.  Subjective:     Patient ID: Sara Short , female    DOB: 08/22/1961 , 58 y.o.   MRN: [id number]   Chief Complaint  Patient presents with  . Annual Exam  . Hypertension    HPI  She is here today for a full physical exam. She is not followed by GYN. Her last pap smear was in 2018.   Hypertension This is a chronic problem. The current episode started more than 1 year ago. The problem has been gradually improving since onset. The problem is controlled. Pertinent negatives include no blurred vision, chest pain, palpitations or shortness of breath. The current treatment provides moderate improvement. Compliance problems include exercise.      Past Medical History:  Diagnosis Date  . Allergic rhinitis   . HTN (hypertension)   . Obesity      Family History  Problem Relation Age of Onset  . Diverticulitis Mother   . Hypertension Mother   . Diabetes Father      Current Outpatient Medications:  .  Chlorpheniramine Maleate (ALLERGY PO), Take by mouth., Disp: , Rfl:  .  Cholecalciferol (VITAMIN D3) 25 MCG (1000 UT) CAPS, Take 1 capsule by mouth. 1 per day, Disp: , Rfl:  .  desogestrel-ethinyl estradiol (ISIBLOOM) 0.15-30 MG-MCG tablet, Take 1 tablet by mouth daily., Disp: 28 tablet, Rfl: 0 .  Magnesium 400 MG CAPS, Take 400 mg by mouth Nightly., Disp: 90 capsule, Rfl: 1 .  Multiple Vitamin (MULTIVITAMIN) capsule, Take 1 capsule by mouth daily., Disp: , Rfl:  .  simvastatin (ZOCOR) 20 MG tablet, Take 1 tablet (20 mg total) by mouth at bedtime., Disp: 90 tablet, Rfl: 2 .  telmisartan-hydrochlorothiazide (MICARDIS HCT) 40-12.5 MG tablet, Take 1 tablet by mouth daily., Disp: 90 tablet, Rfl: 2    Allergies  Allergen Reactions  . Crestor [Rosuvastatin Calcium] Hives  . Lipitor [Atorvastatin Calcium] Hives     The patient states she uses oral progesterone-only contraceptive for birth control. Last LMP was Patient's last menstrual period was 09/05/2019.. Negative for Dysmenorrhea  Negative for: breast discharge, breast lump(s), breast pain and breast self exam. Associated symptoms include abnormal vaginal bleeding. Pertinent negatives include abnormal bleeding (hematology), anxiety, decreased libido, depression, difficulty falling sleep, dyspareunia, history of infertility, nocturia, sexual dysfunction, sleep disturbances, urinary incontinence, urinary urgency, vaginal discharge and vaginal itching. Diet regular.The patient states her exercise level is  intermittent.   . The patient's tobacco use is:  Social History   Tobacco Use  Smoking Status Former Smoker  . Packs/day: 0.50  . Years: 20.00  . Pack years: 10.00  . Types: Cigarettes  . Quit date: 03/12/2019  . Years since quitting: 0.5  Smokeless Tobacco Never Used  Tobacco Comment   She quit in June  . She has been exposed to passive smoke. The patient's alcohol use is:  Social History   Substance and Sexual Activity  Alcohol Use Never    Review of Systems  Constitutional: Negative.   HENT: Negative.   Eyes: Negative.  Negative for blurred vision.  Respiratory: Negative.  Negative for shortness of breath.   Cardiovascular: Negative.  Negative for chest pain and palpitations.  Endocrine: Negative.   Genitourinary: Negative.   Musculoskeletal: Negative.   Skin:  Negative.   Allergic/Immunologic: Negative.   Neurological: Negative.   Hematological: Negative.   Psychiatric/Behavioral: Negative.      Today's Vitals   09/24/19 1457  BP: (!) 142/88  Pulse: 84  Temp: 98.8 F (37.1 C)  TempSrc: Oral  Weight: 240 lb 12.8 oz (109.2 kg)  Height: _0  (1.702 m)   Body mass index is 37.71 kg/m.   Objective:   Physical Exam Vitals and nursing note reviewed.  Constitutional:      Appearance: Normal appearance. She is obese.  HENT:     Head: Normocephalic and atraumatic.     Right Ear: Tympanic membrane, ear canal and external ear normal.     Left Ear: Tympanic membrane, ear canal and external ear normal.     Nose:     Comments: Deferred, masked    Mouth/Throat:     Comments: Deferred, masked Eyes:     Extraocular Movements: Extraocular movements intact.     Conjunctiva/sclera: Conjunctivae normal.     Pupils: Pupils are equal, round, and reactive to light.  Neck:     Comments: Left thyroid fullness Cardiovascular:     Rate and Rhythm: Normal rate and regular rhythm.     Pulses: Normal pulses.     Heart sounds: Normal heart sounds.  Pulmonary:     Effort: Pulmonary effort is normal.     Breath sounds: Normal breath sounds.  Chest:     Breasts: Tanner Score is 5.        Right: Normal.        Left: Normal.  Abdominal:     General: Bowel sounds are normal.     Palpations: Abdomen is soft.     Comments: Rounded, soft.   Genitourinary:    Comments: deferred Musculoskeletal:        General: Normal range of motion.     Cervical back: Normal range of motion and neck supple.  Skin:    General: Skin is warm and dry.  Neurological:     General: No focal deficit present.     Mental Status: She is alert and oriented to person, place, and time.  Psychiatric:        Mood and Affect: Mood normal.        Behavior: Behavior normal.         Assessment And Plan:      1. Routine general medical examination at health care facility  A full exam was performed.  Importance of monthly self breast exams was discussed with the patient. PATIENT HAS BEEN ADVISED TO GET 30-45 MINUTES REGULAR EXERCISE NO LESS THAN FOUR TO FIVE DAYS PER WEEK - BOTH WEIGHTBEARING EXERCISES AND AEROBIC ARE RECOMMENDED.  SHE WAS ADVISED TO FOLLOW A HEALTHY DIET WITH AT LEAST SIX FRUITS/VEGGIES PER DAY, DECREASE INTAKE  OF RED MEAT, AND TO INCREASE FISH INTAKE TO TWO DAYS PER WEEK.  MEATS/FISH SHOULD NOT BE FRIED, BAKED OR BROILED IS PREFERABLE.  I SUGGEST WEARING SPF 50 SUNSCREEN ON EXPOSED PARTS AND ESPECIALLY WHEN IN THE DIRECT SUNLIGHT FOR AN EXTENDED PERIOD OF TIME.  PLEASE AVOID FAST FOOD RESTAURANTS AND INCREASE YOUR WATER INTAKE.  - CMP14+EGFR - CBC - Lipid panel - Hemoglobin A1c - TSH  2. Essential hypertension, benign  Chronic, fair control. She will continue with current meds. She is encouraged to avoid adding salt to her foods. EKG performed, no new changes. She will rto in six months for re-evaluation.   - EKG 12-Lead - POCT UA - Microalbumin -  POCT Urinalysis Dipstick (81002)  3. Goiter  I will refer her for thyroid ultrasound. I will check TSH today as well.   - US Soft Tissue Head/Neck   Maximino Greenland, MD    THE PATIENT IS ENCOURAGED TO PRACTICE SOCIAL DISTANCING DUE TO THE COVID-19 PANDEMIC.

## 2019-09-25 LAB — CMP14+EGFR
ALT: 13 IU/L (ref 0–32)
AST: 16 IU/L (ref 0–40)
Albumin/Globulin Ratio: 1.3 (ref 1.2–2.2)
Albumin: 4.1 g/dL (ref 3.8–4.9)
Alkaline Phosphatase: 64 IU/L (ref 39–117)
BUN/Creatinine Ratio: 18 (ref 9–23)
BUN: 13 mg/dL (ref 6–24)
Bilirubin Total: 0.3 mg/dL (ref 0.0–1.2)
CO2: 20 mmol/L (ref 20–29)
Calcium: 9.1 mg/dL (ref 8.7–10.2)
Chloride: 105 mmol/L (ref 96–106)
Creatinine, Ser: 0.72 mg/dL (ref 0.57–1.00)
GFR calc Af Amer: 108 mL/min/{1.73_m2} (ref >59)
GFR calc non Af Amer: 93 mL/min/{1.73_m2} (ref >59)
Globulin, Total: 3.1 g/dL (ref 1.5–4.5)
Glucose: 79 mg/dL (ref 65–99)
Potassium: 3.9 mmol/L (ref 3.5–5.2)
Sodium: 140 mmol/L (ref 134–144)
Total Protein: 7.2 g/dL (ref 6.0–8.5)

## 2019-09-25 LAB — HEMOGLOBIN A1C
Est. average glucose Bld gHb Est-mCnc: 114 mg/dL
Hgb A1c MFr Bld: 5.6 % (ref 4.8–5.6)

## 2019-09-25 LAB — CBC
Hematocrit: 37.9 % (ref 34.0–46.6)
Hemoglobin: 12.9 g/dL (ref 11.1–15.9)
MCH: 30.4 pg (ref 26.6–33.0)
MCHC: 34 g/dL (ref 31.5–35.7)
MCV: 89 fL (ref 79–97)
Platelets: 271 10*3/uL (ref 150–450)
RBC: 4.24 x10E6/uL (ref 3.77–5.28)
RDW: 12.7 % (ref 11.7–15.4)
WBC: 4.8 10*3/uL (ref 3.4–10.8)

## 2019-09-25 LAB — LIPID PANEL
Chol/HDL Ratio: 3 ratio (ref 0.0–4.4)
Cholesterol, Total: 164 mg/dL (ref 100–199)
HDL: 54 mg/dL (ref >39)
LDL Chol Calc (NIH): 88 mg/dL (ref 0–99)
Triglycerides: 125 mg/dL (ref 0–149)
VLDL Cholesterol Cal: 22 mg/dL (ref 5–40)

## 2019-09-25 LAB — TSH: TSH: 1.35 u[IU]/mL (ref 0.450–4.500)

## 2019-11-26 ENCOUNTER — Telehealth: Payer: Self-pay

## 2019-11-26 NOTE — Telephone Encounter (Signed)
Called pt to schedule appt for allergic reaction. Pt has already scheduled appt with dermatology.

## 2019-11-29 ENCOUNTER — Telehealth: Payer: Self-pay

## 2019-11-29 NOTE — Telephone Encounter (Signed)
CONTACTED PT TO SEE IF SHE STILL NEEDED APPT FOR ALLERGIC REACTION SHE STATED SHE WAS HAVING. PT STATED SHE WAS SEEN BY DERMATOLOGIST AND SHE IS FINE

## 2019-12-20 ENCOUNTER — Other Ambulatory Visit: Payer: Self-pay | Admitting: Internal Medicine

## 2020-01-03 ENCOUNTER — Ambulatory Visit: Payer: Self-pay | Attending: Family

## 2020-01-03 DIAGNOSIS — Z23 Encounter for immunization: Secondary | ICD-10-CM

## 2020-01-03 NOTE — Progress Notes (Signed)
   Covid-19 Vaccination Clinic  Name:  Sara Short    MRN: [id number] DOB: 1961/08/09  01/03/2020  [name] was observed post Covid-19 immunization for 15 minutes without incident. She was provided with Vaccine Information Sheet and instruction to access the V-Safe system.   [name] was instructed to call 911 with any severe reactions post vaccine: Marland Kitchen Difficulty breathing  . Swelling of face and throat  . A fast heartbeat  . A bad rash all over body  . Dizziness and weakness   Immunizations Administered    Name Date Dose VIS Date Route   Moderna COVID-19 Vaccine 01/03/2020  3:03 PM 0.5 mL 09/11/2019 Intramuscular   Manufacturer: Moderna   Lot: [id number]   NDC: 81856-314-97

## 2020-01-15 ENCOUNTER — Other Ambulatory Visit: Payer: Self-pay | Admitting: Internal Medicine

## 2020-02-05 ENCOUNTER — Ambulatory Visit: Payer: Self-pay | Attending: Family

## 2020-02-05 DIAGNOSIS — Z23 Encounter for immunization: Secondary | ICD-10-CM

## 2020-02-05 NOTE — Progress Notes (Signed)
   Covid-19 Vaccination Clinic  Name:  Sara Short    MRN: [id number] DOB: 1961-03-30  02/05/2020  Sara Short was observed post Covid-19 immunization for 15 minutes without incident. She was provided with Vaccine Information Sheet and instruction to access the V-Safe system.   Sara Short was instructed to call 911 with any severe reactions post vaccine: Marland Kitchen Difficulty breathing  . Swelling of face and throat  . A fast heartbeat  . A bad rash all over body  . Dizziness and weakness   Immunizations Administered    Name Date Dose VIS Date Route   Moderna COVID-19 Vaccine 02/05/2020 10:05 AM 0.5 mL 09/2019 Intramuscular   Manufacturer: Moderna   Lot: [id number]   NDC: 29924-268-34

## 2020-02-10 ENCOUNTER — Other Ambulatory Visit: Payer: Self-pay | Admitting: Internal Medicine

## 2020-03-12 ENCOUNTER — Other Ambulatory Visit: Payer: Self-pay | Admitting: Internal Medicine

## 2020-03-24 ENCOUNTER — Ambulatory Visit: Payer: BC Managed Care – PPO | Admitting: Internal Medicine

## 2020-03-24 ENCOUNTER — Encounter: Payer: Self-pay | Admitting: Internal Medicine

## 2020-03-24 VITALS — BP 152/88 | HR 81 | Temp 98.7°F | Ht 67.0 in | Wt 240.8 lb

## 2020-03-24 DIAGNOSIS — B373 Candidiasis of vulva and vagina: Secondary | ICD-10-CM

## 2020-03-24 DIAGNOSIS — B3731 Acute candidiasis of vulva and vagina: Secondary | ICD-10-CM

## 2020-03-24 DIAGNOSIS — L304 Erythema intertrigo: Secondary | ICD-10-CM | POA: Diagnosis not present

## 2020-03-24 DIAGNOSIS — I10 Essential (primary) hypertension: Secondary | ICD-10-CM

## 2020-03-24 DIAGNOSIS — Z6837 Body mass index (BMI) 37.0-37.9, adult: Secondary | ICD-10-CM

## 2020-03-24 DIAGNOSIS — E559 Vitamin D deficiency, unspecified: Secondary | ICD-10-CM

## 2020-03-24 MED ORDER — FLUCONAZOLE 150 MG PO TABS: ORAL_TABLET | ORAL | 0 refills | Status: DC

## 2020-03-24 MED ORDER — NYSTATIN 100000 UNIT/GM EX POWD
1.0000 | Freq: Three times a day (TID) | CUTANEOUS | 0 refills | Status: DC
Start: 2020-03-24 — End: 2023-06-14

## 2020-03-24 NOTE — Progress Notes (Signed)
This visit occurred during the SARS-CoV-2 public health emergency.  Safety protocols were in place, including screening questions prior to the visit, additional usage of staff PPE, and extensive cleaning of exam room while observing appropriate contact time as indicated for disinfecting solutions.  Subjective:     Patient ID: Sara Short , female    DOB: 12/26/1960 , 59 y.o.   MRN: [id number]   Chief Complaint  Patient presents with  . Hypertension    HPI  She presents today for BP check. She reports compliance with meds. She feels like "I am falling apart".   Hypertension This is a chronic problem. The current episode started more than 1 year ago. The problem has been gradually improving since onset. The problem is uncontrolled. Pertinent negatives include no blurred vision, chest pain, palpitations or shortness of breath. Risk factors for coronary artery disease include obesity, post-menopausal state and sedentary lifestyle. The current treatment provides moderate improvement. Compliance problems include exercise.      Past Medical History:  Diagnosis Date  . Allergic rhinitis   . HTN (hypertension)   . Obesity      Family History  Problem Relation Age of Onset  . Diverticulitis Mother   . Hypertension Mother   . Diabetes Father      Current Outpatient Medications:  .  Chlorpheniramine Maleate (ALLERGY PO), Take by mouth., Disp: , Rfl:  .  Cholecalciferol (VITAMIN D3) 25 MCG (1000 UT) CAPS, Take 1 capsule by mouth. 1 per day, Disp: , Rfl:  .  ISIBLOOM 0.15-30 MG-MCG tablet, TAKE 1 TABLET BY MOUTH EVERY DAY, Disp: 28 tablet, Rfl: 0 .  Magnesium 400 MG CAPS, Take 400 mg by mouth Nightly., Disp: 90 capsule, Rfl: 1 .  Multiple Vitamin (MULTIVITAMIN) capsule, Take 1 capsule by mouth daily., Disp: , Rfl:  .  simvastatin (ZOCOR) 20 MG tablet, Take 1 tablet (20 mg total) by mouth at bedtime., Disp: 90 tablet, Rfl: 2 .  telmisartan-hydrochlorothiazide (MICARDIS HCT)  40-12.5 MG tablet, Take 1 tablet by mouth daily., Disp: 90 tablet, Rfl: 2   Allergies  Allergen Reactions  . Crestor [Rosuvastatin Calcium] Hives  . Lipitor [Atorvastatin Calcium] Hives     Review of Systems  Constitutional: Negative.   Eyes: Negative for blurred vision.  Respiratory: Negative.  Negative for shortness of breath.   Cardiovascular: Negative.  Negative for chest pain and palpitations.  Gastrointestinal: Negative.   Genitourinary:       She c/o vaginal itching. She was recently prescribed an abx by her dermatologist. She can't recall the name. She denies having a discharge.   Skin:       She c/o itchy rash underneath her breast. She tried a "cream" without relief. She can't recall name of the cream.   Neurological: Negative.   Psychiatric/Behavioral: Negative.      Today's Vitals   03/24/20 0837  BP: (!) 152/88  Pulse: 81  Temp: 98.7 F (37.1 C)  TempSrc: Oral  Weight: 240 lb 12.8 oz (109.2 kg)  Height: 5' 7"  (1.702 m)   Body mass index is 37.71 kg/m.   Wt Readings from Last 3 Encounters:  03/24/20 240 lb 12.8 oz (109.2 kg)  09/24/19 240 lb 12.8 oz (109.2 kg)  05/07/19 243 lb 3.2 oz (110.3 kg)   BP Readings from Last 3 Encounters:  03/24/20 (!) 152/88  09/24/19 (!) 142/88  05/07/19 124/86    Objective:  Physical Exam Vitals and nursing note reviewed.  Constitutional:  Appearance: Normal appearance. She is obese.  HENT:     Head: Normocephalic and atraumatic.  Cardiovascular:     Rate and Rhythm: Normal rate and regular rhythm.     Heart sounds: Normal heart sounds.  Pulmonary:     Effort: Pulmonary effort is normal.     Breath sounds: Normal breath sounds.  Skin:    General: Skin is warm.     Comments: Erythematous area underneath b/l breasts, some hyperpigmentation is present. No vesicular lesions noted.   Neurological:     General: No focal deficit present.     Mental Status: She is alert.  Psychiatric:        Mood and Affect: Mood  normal.        Behavior: Behavior normal.         Assessment And Plan:     1. Essential hypertension, benign  Chronic, uncontrolled. She agrees to increase telmisartan/hct 40/12.86m to two tablets daily. I will check her renal function today. She will rto in 2 weeks for nurse visit. I will check repeat bmp at that time.   - CMP14+EGFR - Insulin, random(561)  2. Vaginal candidiasis  She was given rx diflucan 1560mto take today, repeat in 48 hours. She was advised to HOLD simvastatin until Thursday.   3. Intertrigo  She was given rx nystatin powder to affected area twice daily prn.   4. Class 2 severe obesity due to excess calories with serious comorbidity and body mass index (BMI) of 37.0 to 37.9 in adult [facility] She is encouraged to strive for BMI less than 30 to decrease cardiac risk. Advised to exercise 30 minutes five days per week.   5. Vitamin D deficiency disease  I WILL CHECK A VIT D LEVEL AND SUPPLEMENT AS NEEDED.  ALSO ENCOURAGED TO SPEND 15 MINUTES IN THE SUN DAILY.  - Vitamin D (25 hydroxy)   RoMaximino GreenlandMD    THE PATIENT IS ENCOURAGED TO PRACTICE SOCIAL DISTANCING DUE TO THE COVID-19 PANDEMIC.

## 2020-03-24 NOTE — Patient Instructions (Addendum)
Start diflucan today, take with dinner. Take another one on Wednesday.  Do NOT take simvastatin until Thursday.    DASH Eating Plan DASH stands for "Dietary Approaches to Stop Hypertension." The DASH eating plan is a healthy eating plan that has been shown to reduce high blood pressure (hypertension). It may also reduce your risk for type 2 diabetes, heart disease, and stroke. The DASH eating plan may also help with weight loss. What are tips for following this plan?  General guidelines  Avoid eating more than 2,300 mg (milligrams) of salt (sodium) a day. If you have hypertension, you may need to reduce your sodium intake to 1,500 mg a day.  Limit alcohol intake to no more than 1 drink a day for nonpregnant women and 2 drinks a day for men. One drink equals 12 oz of beer, 5 oz of wine, or 1 oz of hard liquor.  Work with your health care provider to maintain a healthy body weight or to lose weight. Ask what an ideal weight is for you.  Get at least 30 minutes of exercise that causes your heart to beat faster (aerobic exercise) most days of the week. Activities may include walking, swimming, or biking.  Work with your health care provider or diet and nutrition specialist (dietitian) to adjust your eating plan to your individual calorie needs. Reading food labels   Check food labels for the amount of sodium per serving. Choose foods with less than 5 percent of the Daily Value of sodium. Generally, foods with less than 300 mg of sodium per serving fit into this eating plan.  To find whole grains, look for the word "whole" as the first word in the ingredient list. Shopping  Buy products labeled as "low-sodium" or "no salt added."  Buy fresh foods. Avoid canned foods and premade or frozen meals. Cooking  Avoid adding salt when cooking. Use salt-free seasonings or herbs instead of table salt or sea salt. Check with your health care provider or pharmacist before using salt substitutes.  Do  not fry foods. Cook foods using healthy methods such as baking, boiling, grilling, and broiling instead.  Cook with heart-healthy oils, such as olive, canola, soybean, or sunflower oil. Meal planning  Eat a balanced diet that includes: ? 5 or more servings of fruits and vegetables each day. At each meal, try to fill half of your plate with fruits and vegetables. ? Up to 6-8 servings of whole grains each day. ? Less than 6 oz of lean meat, poultry, or fish each day. A 3-oz serving of meat is about the same size as a deck of cards. One egg equals 1 oz. ? 2 servings of low-fat dairy each day. ? A serving of nuts, seeds, or beans 5 times each week. ? Heart-healthy fats. Healthy fats called Omega-3 fatty acids are found in foods such as flaxseeds and coldwater fish, like sardines, salmon, and mackerel.  Limit how much you eat of the following: ? Canned or prepackaged foods. ? Food that is high in trans fat, such as fried foods. ? Food that is high in saturated fat, such as fatty meat. ? Sweets, desserts, sugary drinks, and other foods with added sugar. ? Full-fat dairy products.  Do not salt foods before eating.  Try to eat at least 2 vegetarian meals each week.  Eat more home-cooked food and less restaurant, buffet, and fast food.  When eating at a restaurant, ask that your food be prepared with less salt or no  salt, if possible. What foods are recommended? The items listed may not be a complete list. Talk with your dietitian about what dietary choices are best for you. Grains Whole-grain or whole-wheat bread. Whole-grain or whole-wheat pasta. Brown rice. Orpah Cobb. Bulgur. Whole-grain and low-sodium cereals. Pita bread. Low-fat, low-sodium crackers. Whole-wheat flour tortillas. Vegetables Fresh or frozen vegetables (raw, steamed, roasted, or grilled). Low-sodium or reduced-sodium tomato and vegetable juice. Low-sodium or reduced-sodium tomato sauce and tomato paste. Low-sodium or  reduced-sodium canned vegetables. Fruits All fresh, dried, or frozen fruit. Canned fruit in natural juice (without added sugar). Meat and other protein foods Skinless chicken or [place]. Ground chicken or [place]. Pork with fat trimmed off. Fish and seafood. Egg whites. Dried beans, peas, or lentils. Unsalted nuts, nut butters, and seeds. Unsalted canned beans. Lean cuts of beef with fat trimmed off. Low-sodium, lean deli meat. Dairy Low-fat (1%) or fat-free (skim) milk. Fat-free, low-fat, or reduced-fat cheeses. Nonfat, low-sodium ricotta or cottage cheese. Low-fat or nonfat yogurt. Low-fat, low-sodium cheese. Fats and oils Soft margarine without trans fats. Vegetable oil. Low-fat, reduced-fat, or light mayonnaise and salad dressings (reduced-sodium). Canola, safflower, olive, soybean, and sunflower oils. Avocado. Seasoning and other foods Herbs. Spices. Seasoning mixes without salt. Unsalted popcorn and pretzels. Fat-free sweets. What foods are not recommended? The items listed may not be a complete list. Talk with your dietitian about what dietary choices are best for you. Grains Baked goods made with fat, such as croissants, muffins, or some breads. Dry pasta or rice meal packs. Vegetables Creamed or fried vegetables. Vegetables in a cheese sauce. Regular canned vegetables (not low-sodium or reduced-sodium). Regular canned tomato sauce and paste (not low-sodium or reduced-sodium). Regular tomato and vegetable juice (not low-sodium or reduced-sodium). Rosita Fire. Olives. Fruits Canned fruit in a light or heavy syrup. Fried fruit. Fruit in cream or butter sauce. Meat and other protein foods Fatty cuts of meat. Ribs. Fried meat. Tomasa Blase. Sausage. Bologna and other processed lunch meats. Salami. Fatback. Hotdogs. Bratwurst. Salted nuts and seeds. Canned beans with added salt. Canned or smoked fish. Whole eggs or egg yolks. Chicken or [place] with skin. Dairy Whole or 2% milk, cream, and half-and-half.  Whole or full-fat cream cheese. Whole-fat or sweetened yogurt. Full-fat cheese. Nondairy creamers. Whipped toppings. Processed cheese and cheese spreads. Fats and oils Butter. Stick margarine. Lard. Shortening. Ghee. Bacon fat. Tropical oils, such as coconut, palm kernel, or palm oil. Seasoning and other foods Salted popcorn and pretzels. Onion salt, garlic salt, seasoned salt, table salt, and sea salt. Worcestershire sauce. Tartar sauce. Barbecue sauce. Teriyaki sauce. Soy sauce, including reduced-sodium. Steak sauce. Canned and packaged gravies. Fish sauce. Oyster sauce. Cocktail sauce. Horseradish that you find on the shelf. Ketchup. Mustard. Meat flavorings and tenderizers. Bouillon cubes. Hot sauce and Tabasco sauce. Premade or packaged marinades. Premade or packaged taco seasonings. Relishes. Regular salad dressings. Where to find more information:  National Heart, Lung, and Blood Institute: PopSteam.is  American Heart Association: www.heart.org Summary  The DASH eating plan is a healthy eating plan that has been shown to reduce high blood pressure (hypertension). It may also reduce your risk for type 2 diabetes, heart disease, and stroke.  With the DASH eating plan, you should limit salt (sodium) intake to 2,300 mg a day. If you have hypertension, you may need to reduce your sodium intake to 1,500 mg a day.  When on the DASH eating plan, aim to eat more fresh fruits and vegetables, whole grains, lean proteins, low-fat dairy, and heart-healthy  fats.  Work with your health care provider or diet and nutrition specialist (dietitian) to adjust your eating plan to your individual calorie needs. This information is not intended to replace advice given to you by your health care provider. Make sure you discuss any questions you have with your health care provider. Document Revised: 09/09/2017 Document Reviewed: 09/20/2016 Elsevier Patient Education  2020 [employer].

## 2020-03-25 LAB — CMP14+EGFR
ALT: 15 IU/L (ref 0–32)
AST: 15 IU/L (ref 0–40)
Albumin/Globulin Ratio: 1.3 (ref 1.2–2.2)
Albumin: 3.8 g/dL (ref 3.8–4.9)
Alkaline Phosphatase: 57 IU/L (ref 48–121)
BUN/Creatinine Ratio: 19 (ref 9–23)
BUN: 15 mg/dL (ref 6–24)
Bilirubin Total: 0.2 mg/dL (ref 0.0–1.2)
CO2: 22 mmol/L (ref 20–29)
Calcium: 9 mg/dL (ref 8.7–10.2)
Chloride: 105 mmol/L (ref 96–106)
Creatinine, Ser: 0.77 mg/dL (ref 0.57–1.00)
GFR calc Af Amer: 99 mL/min/{1.73_m2} (ref >59)
GFR calc non Af Amer: 86 mL/min/{1.73_m2} (ref >59)
Globulin, Total: 3 g/dL (ref 1.5–4.5)
Glucose: 86 mg/dL (ref 65–99)
Potassium: 4.1 mmol/L (ref 3.5–5.2)
Sodium: 143 mmol/L (ref 134–144)
Total Protein: 6.8 g/dL (ref 6.0–8.5)

## 2020-03-25 LAB — INSULIN, RANDOM: INSULIN: 13.3 u[IU]/mL (ref 2.6–24.9)

## 2020-03-25 LAB — VITAMIN D 25 HYDROXY (VIT D DEFICIENCY, FRACTURES): Vit D, 25-Hydroxy: 41.1 ng/mL (ref 30.0–100.0)

## 2020-03-27 ENCOUNTER — Other Ambulatory Visit: Payer: Self-pay

## 2020-03-27 MED ORDER — VITAMIN D3 25 MCG (1000 UT) PO CAPS: 2000.0000 [IU] | ORAL_CAPSULE | Freq: Every day | ORAL | 1 refills | Status: AC

## 2020-04-03 ENCOUNTER — Telehealth: Payer: Self-pay

## 2020-04-03 NOTE — Telephone Encounter (Signed)
The patient called and cancelled her appts for next Tuesday because she is out of town with her sick mother and will call back to reschedule because she doesn't know how long her mother will be in the hospical.

## 2020-04-07 ENCOUNTER — Other Ambulatory Visit: Payer: Self-pay

## 2020-04-07 MED ORDER — DESOGESTREL-ETHINYL ESTRADIOL 0.15-30 MG-MCG PO TABS: 1.0000 | ORAL_TABLET | Freq: Every day | ORAL | 1 refills | Status: DC

## 2020-04-07 MED ORDER — TELMISARTAN-HCTZ 40-12.5 MG PO TABS: ORAL_TABLET | ORAL | 2 refills | Status: DC

## 2020-04-08 ENCOUNTER — Other Ambulatory Visit: Payer: BC Managed Care – PPO

## 2020-04-08 ENCOUNTER — Ambulatory Visit: Payer: BC Managed Care – PPO

## 2020-04-29 ENCOUNTER — Other Ambulatory Visit: Payer: Self-pay

## 2020-04-29 MED ORDER — TELMISARTAN-HCTZ 40-12.5 MG PO TABS: ORAL_TABLET | ORAL | 2 refills | Status: DC

## 2020-05-14 ENCOUNTER — Other Ambulatory Visit: Payer: Self-pay

## 2020-05-14 MED ORDER — TELMISARTAN-HCTZ 40-12.5 MG PO TABS: ORAL_TABLET | ORAL | 2 refills | Status: DC

## 2020-05-20 ENCOUNTER — Ambulatory Visit (INDEPENDENT_AMBULATORY_CARE_PROVIDER_SITE_OTHER): Payer: BC Managed Care – PPO

## 2020-05-20 ENCOUNTER — Other Ambulatory Visit: Payer: Self-pay | Admitting: Podiatry

## 2020-05-20 ENCOUNTER — Other Ambulatory Visit: Payer: Self-pay

## 2020-05-20 ENCOUNTER — Telehealth: Payer: Self-pay | Admitting: Podiatry

## 2020-05-20 ENCOUNTER — Ambulatory Visit: Payer: BC Managed Care – PPO | Admitting: Podiatry

## 2020-05-20 DIAGNOSIS — S96912A Strain of unspecified muscle and tendon at ankle and foot level, left foot, initial encounter: Secondary | ICD-10-CM | POA: Diagnosis not present

## 2020-05-20 DIAGNOSIS — M76829 Posterior tibial tendinitis, unspecified leg: Secondary | ICD-10-CM

## 2020-05-20 DIAGNOSIS — M7662 Achilles tendinitis, left leg: Secondary | ICD-10-CM | POA: Diagnosis not present

## 2020-05-20 DIAGNOSIS — M25572 Pain in left ankle and joints of left foot: Secondary | ICD-10-CM | POA: Diagnosis not present

## 2020-05-20 MED ORDER — MELOXICAM 15 MG PO TABS
15.0000 mg | ORAL_TABLET | Freq: Every day | ORAL | 0 refills | Status: DC
Start: 2020-05-20 — End: 2021-02-01

## 2020-05-20 NOTE — Telephone Encounter (Signed)
done

## 2020-05-20 NOTE — Telephone Encounter (Signed)
Pt called checking to see when medication will be call into pharmacy from office visit today

## 2020-05-26 NOTE — Progress Notes (Signed)
Subjective:   Patient ID: Sara Short, female   DOB: 59 y.o.   MRN: [id number]   HPI 59 year old female presents the office today for concerns of left ankle pain, mostly on the medial aspect.  She states that she had a history of fracture back in 2016 and since she feels that her ankle is shifted positions.  She is also noted swelling as well as discomfort mostly the medial aspect ankle.  No recent treatment.  She has no other concerns today.   Review of Systems  All other systems reviewed and are negative.  Past Medical History:  Diagnosis Date  . Allergic rhinitis   . HTN (hypertension)   . Obesity     Past Surgical History:  Procedure Laterality Date  . ECTOPIC PREGNANCY SURGERY  2003     Current Outpatient Medications:  .  alclomethasone (ACLOVATE) 0.05 % cream, Apply topically 2 (two) times daily as needed., Disp: , Rfl:  .  Chlorpheniramine Maleate (ALLERGY PO), Take by mouth., Disp: , Rfl:  .  Cholecalciferol (VITAMIN D3) 25 MCG (1000 UT) CAPS, Take 2 capsules (2,000 Units total) by mouth daily. 1 per day, Disp: 60 capsule, Rfl: 1 .  desogestrel-ethinyl estradiol (ISIBLOOM) 0.15-30 MG-MCG tablet, Take 1 tablet by mouth daily., Disp: 28 tablet, Rfl: 1 .  fluconazole (DIFLUCAN) 150 MG tablet, One tab po today, and repeat on Wednesday. Please hold simvastatin until Th, Disp: 2 tablet, Rfl: 0 .  Magnesium 400 MG CAPS, Take 400 mg by mouth Nightly., Disp: 90 capsule, Rfl: 1 .  Multiple Vitamin (MULTIVITAMIN) capsule, Take 1 capsule by mouth daily., Disp: , Rfl:  .  nystatin (NYSTATIN) powder, Apply 1 application topically 3 (three) times daily., Disp: 60 g, Rfl: 0 .  simvastatin (ZOCOR) 20 MG tablet, Take 1 tablet (20 mg total) by mouth at bedtime., Disp: 90 tablet, Rfl: 2 .  telmisartan-hydrochlorothiazide (MICARDIS HCT) 40-12.5 MG tablet, Take 2 tablets by mouth daily, Disp: 60 tablet, Rfl: 2 .  meloxicam (MOBIC) 15 MG tablet, Take 1 tablet (15 mg total) by mouth  daily., Disp: 30 tablet, Rfl: 0  Allergies  Allergen Reactions  . Crestor [Rosuvastatin Calcium] Hives  . Lipitor [Atorvastatin Calcium] Hives        Objective:  Physical Exam  General: AAO x3, NAD  Dermatological: Skin is warm, dry and supple bilateral. There are no open sores, no preulcerative lesions, no rash or signs of infection present.  Vascular: Dorsalis Pedis artery and Posterior Tibial artery pedal pulses are 2/4 bilateral with immedate capillary fill time. There is no pain with calf compression, swelling, warmth, erythema.   Neruologic: Grossly intact via light touch bilateral. Negative tinel sign.    Musculoskeletal: Majority of tenderness is mostly to the medial aspect of the left ankle and there is localized edema to this area.  Tenderness on the course of the posterior tibial, flexor tendons.  Mild discomfort anterior ankle joint.  No other areas of discomfort identified.  Muscular strength 5/5 in all groups tested bilateral.  Gait: Unassisted, Nonantalgic.       Assessment:   Right chronic ankle pain, rule out posterior tibial tendon tear    Plan:  -Treatment options discussed including all alternatives, risks, and complications -Etiology of symptoms were discussed -X-rays were obtained and reviewed with the patient.  Dorsal spurring present for the navicular joint.  No other areas of fracture identified. -This point given her prolonged nature of symptoms I recommended MRI.  MRI of left ankle  was ordered to rule out posterior tibial tendon tear.  This is for possible surgical planning. -For now continue ankle brace, anti-inflammatories as needed.  Prescribe meloxicam.  Vivi Barrack DPM

## 2020-05-27 ENCOUNTER — Other Ambulatory Visit: Payer: Self-pay

## 2020-05-27 ENCOUNTER — Telehealth: Payer: Self-pay

## 2020-05-27 ENCOUNTER — Other Ambulatory Visit: Payer: BC Managed Care – PPO

## 2020-05-27 ENCOUNTER — Ambulatory Visit: Payer: BC Managed Care – PPO

## 2020-05-27 VITALS — BP 130/76 | HR 84 | Temp 98.2°F | Ht 67.0 in | Wt 240.2 lb

## 2020-05-27 DIAGNOSIS — Z79899 Other long term (current) drug therapy: Secondary | ICD-10-CM

## 2020-05-27 DIAGNOSIS — I10 Essential (primary) hypertension: Secondary | ICD-10-CM

## 2020-05-27 MED ORDER — TELMISARTAN-HCTZ 80-25 MG PO TABS: 1.0000 | ORAL_TABLET | Freq: Every day | ORAL | 1 refills | Status: DC

## 2020-05-27 NOTE — Telephone Encounter (Signed)
Pt stated Dr. Loreta Ave put her on Meloxicam (mobic) 15mg  for her ankle. She wants to make sure that medication is ok to take with her medications you prescribe to her? She ask if someone can give her a call with your answer   Per RS she wouldn't advise her to take the medication everyday it can start to mess with her kidneys after taking for a long period of time. She suggest taking the medication every other day, then start taking as needed. LVM for pt

## 2020-05-27 NOTE — Progress Notes (Signed)
Pt presents today for a b/p check she is currently taking telmisartan-hydrochlorothiazide 2 pills a day in the morning her  b/p today 130/76  Per RS send telmisartan-hydrochlorothiazide 80-25 1 pill a day

## 2020-05-27 NOTE — Telephone Encounter (Signed)
Pt stated Dr. Loreta Ave put her on Meloxicam (mobic) 15mg  for her ankle. She wants to make sure that medication is ok to take with her medications you prescribe to her? She ask if someone can give her a call with your answer

## 2020-05-27 NOTE — Progress Notes (Signed)
bmp 

## 2020-05-28 ENCOUNTER — Telehealth: Payer: Self-pay | Admitting: *Deleted

## 2020-05-28 LAB — BMP8+EGFR
BUN/Creatinine Ratio: 17 (ref 9–23)
BUN: 13 mg/dL (ref 6–24)
CO2: 24 mmol/L (ref 20–29)
Calcium: 9.3 mg/dL (ref 8.7–10.2)
Chloride: 105 mmol/L (ref 96–106)
Creatinine, Ser: 0.76 mg/dL (ref 0.57–1.00)
GFR calc Af Amer: 100 mL/min/{1.73_m2} (ref >59)
GFR calc non Af Amer: 87 mL/min/{1.73_m2} (ref >59)
Glucose: 93 mg/dL (ref 65–99)
Potassium: 3.8 mmol/L (ref 3.5–5.2)
Sodium: 140 mmol/L (ref 134–144)

## 2020-05-28 NOTE — Telephone Encounter (Signed)
Called [employer] and spoke with Art A and the procedure code 05183 was approved and the authorization number is [id number] and is valid 05/28/2020 thru 11/23/2020 and faxed a copy to [facility]. Misty Stanley

## 2020-06-11 ENCOUNTER — Other Ambulatory Visit: Payer: Self-pay

## 2020-06-11 MED ORDER — DESOGESTREL-ETHINYL ESTRADIOL 0.15-30 MG-MCG PO TABS: 1.0000 | ORAL_TABLET | Freq: Every day | ORAL | 3 refills | Status: DC

## 2020-06-13 ENCOUNTER — Other Ambulatory Visit: Payer: Self-pay | Admitting: Internal Medicine

## 2020-06-19 ENCOUNTER — Other Ambulatory Visit: Payer: BC Managed Care – PPO

## 2020-07-10 ENCOUNTER — Other Ambulatory Visit: Payer: Self-pay | Admitting: Internal Medicine

## 2020-07-10 DIAGNOSIS — Z1231 Encounter for screening mammogram for malignant neoplasm of breast: Secondary | ICD-10-CM

## 2020-08-01 ENCOUNTER — Other Ambulatory Visit: Payer: Self-pay

## 2020-08-01 ENCOUNTER — Ambulatory Visit
Admission: RE | Admit: 2020-08-01 | Discharge: 2020-08-01 | Disposition: A | Discharge status: Home / Self Care | Payer: BC Managed Care – PPO | Source: Ambulatory Visit | Attending: Internal Medicine | Admitting: Internal Medicine

## 2020-08-01 DIAGNOSIS — Z1231 Encounter for screening mammogram for malignant neoplasm of breast: Secondary | ICD-10-CM

## 2020-08-15 ENCOUNTER — Encounter: Payer: Self-pay | Admitting: Internal Medicine

## 2020-08-18 ENCOUNTER — Encounter: Payer: Self-pay | Admitting: Internal Medicine

## 2020-08-30 ENCOUNTER — Other Ambulatory Visit: Payer: Self-pay | Admitting: Internal Medicine

## 2020-10-02 ENCOUNTER — Encounter: Payer: Self-pay | Admitting: Internal Medicine

## 2020-11-24 ENCOUNTER — Encounter: Payer: Self-pay | Admitting: Internal Medicine

## 2020-12-01 ENCOUNTER — Ambulatory Visit (INDEPENDENT_AMBULATORY_CARE_PROVIDER_SITE_OTHER): Payer: BC Managed Care – PPO | Admitting: Internal Medicine

## 2020-12-01 ENCOUNTER — Other Ambulatory Visit (HOSPITAL_COMMUNITY)
Admission: RE | Admit: 2020-12-01 | Discharge: 2020-12-01 | Disposition: A | Discharge status: Home / Self Care | Payer: BC Managed Care – PPO | Source: Ambulatory Visit | Attending: Internal Medicine | Admitting: Internal Medicine

## 2020-12-01 ENCOUNTER — Encounter: Payer: Self-pay | Admitting: Internal Medicine

## 2020-12-01 ENCOUNTER — Other Ambulatory Visit: Payer: Self-pay

## 2020-12-01 VITALS — BP 110/74 | HR 76 | Temp 98.5°F | Ht 67.2 in | Wt 239.4 lb

## 2020-12-01 DIAGNOSIS — E559 Vitamin D deficiency, unspecified: Secondary | ICD-10-CM | POA: Diagnosis not present

## 2020-12-01 DIAGNOSIS — Z01419 Encounter for gynecological examination (general) (routine) without abnormal findings: Secondary | ICD-10-CM | POA: Diagnosis not present

## 2020-12-01 DIAGNOSIS — I10 Essential (primary) hypertension: Secondary | ICD-10-CM | POA: Diagnosis not present

## 2020-12-01 DIAGNOSIS — Z0000 Encounter for general adult medical examination without abnormal findings: Secondary | ICD-10-CM | POA: Diagnosis not present

## 2020-12-01 DIAGNOSIS — Z3009 Encounter for other general counseling and advice on contraception: Secondary | ICD-10-CM

## 2020-12-01 DIAGNOSIS — Z6837 Body mass index (BMI) 37.0-37.9, adult: Secondary | ICD-10-CM

## 2020-12-01 LAB — POCT UA - MICROALBUMIN
Albumin/Creatinine Ratio, Urine, POC: 300
Creatinine, POC: 300 mg/dL
Microalbumin Ur, POC: 80 mg/L

## 2020-12-01 LAB — POC HEMOCCULT BLD/STL (OFFICE/1-CARD/DIAGNOSTIC)
Card #1 Date: 2212022
Fecal Occult Blood, POC: NEGATIVE

## 2020-12-01 LAB — POCT URINALYSIS DIPSTICK
Bilirubin, UA: NEGATIVE
Glucose, UA: NEGATIVE
Leukocytes, UA: NEGATIVE
Nitrite, UA: NEGATIVE
Protein, UA: NEGATIVE
Spec Grav, UA: >=1.03 — AB (ref 1.010–1.025)
Urobilinogen, UA: 0.2 E.U./dL
pH, UA: 5.5 (ref 5.0–8.0)

## 2020-12-01 MED ORDER — NORETHINDRONE 0.35 MG PO TABS: 1.0000 | ORAL_TABLET | Freq: Every day | ORAL | 11 refills | Status: DC

## 2020-12-01 NOTE — Progress Notes (Signed)
I,Katawbba Wiggins,acting as a Education administrator for Maximino Greenland, MD.,have documented all relevant documentation on the behalf of Maximino Greenland, MD,as directed by  Maximino Greenland, MD while in the presence of Maximino Greenland, MD.  This visit occurred during the SARS-CoV-2 public health emergency.  Safety protocols were in place, including screening questions prior to the visit, additional usage of staff PPE, and extensive cleaning of exam room while observing appropriate contact time as indicated for disinfecting solutions.  Subjective:     Patient ID: Sara Short , female    DOB: 1960/12/14 , 60 y.o.   MRN: [id number]   Chief Complaint  Patient presents with  . Annual Exam  . Hypertension    HPI  She is here today for a full physical exam with pap. She is not followed by GYN. She reports compliance with meds. She denies headaches, chest pain and shortness of breath. She admits she is not getting as much exercise as she should.   Hypertension This is a chronic problem. The current episode started more than 1 year ago. The problem has been gradually improving since onset. The problem is controlled. Pertinent negatives include no blurred vision, chest pain, palpitations or shortness of breath. The current treatment provides moderate improvement. Compliance problems include exercise.      Past Medical History:  Diagnosis Date  . Allergic rhinitis   . HTN (hypertension)   . Obesity      Family History  Problem Relation Age of Onset  . Diverticulitis Mother   . Hypertension Mother   . Diabetes Father      Current Outpatient Medications:  .  alclomethasone (ACLOVATE) 0.05 % cream, Apply topically 2 (two) times daily as needed., Disp: , Rfl:  .  Chlorpheniramine Maleate (ALLERGY PO), Take by mouth., Disp: , Rfl:  .  Cholecalciferol (VITAMIN D3) 25 MCG (1000 UT) CAPS, Take 2 capsules (2,000 Units total) by mouth daily. 1 per day, Disp: 60 capsule, Rfl: 1 .  fluconazole  (DIFLUCAN) 150 MG tablet, One tab po today, and repeat on Wednesday. Please hold simvastatin until Th, Disp: 2 tablet, Rfl: 0 .  Magnesium 400 MG CAPS, Take 400 mg by mouth Nightly., Disp: 90 capsule, Rfl: 1 .  meloxicam (MOBIC) 15 MG tablet, Take 1 tablet (15 mg total) by mouth daily., Disp: 30 tablet, Rfl: 0 .  Multiple Vitamin (MULTIVITAMIN) capsule, Take 1 capsule by mouth daily., Disp: , Rfl:  .  norethindrone (ORTHO MICRONOR) 0.35 MG tablet, Take 1 tablet (0.35 mg total) by mouth daily., Disp: 28 tablet, Rfl: 11 .  nystatin (NYSTATIN) powder, Apply 1 application topically 3 (three) times daily., Disp: 60 g, Rfl: 0 .  simvastatin (ZOCOR) 20 MG tablet, TAKE 1 TABLET(20 MG) BY MOUTH AT BEDTIME, Disp: 90 tablet, Rfl: 2 .  telmisartan-hydrochlorothiazide (MICARDIS HCT) 80-25 MG tablet, TAKE 1 TABLET BY MOUTH DAILY, Disp: 90 tablet, Rfl: 1   Allergies  Allergen Reactions  . Crestor [Rosuvastatin Calcium] Hives  . Lipitor [Atorvastatin Calcium] Hives      The patient states she uses OCP (estrogen/progesterone) for birth control. Last LMP was Patient's last menstrual period was 09/05/2019.. Negative for Dysmenorrhea. Negative for: breast discharge, breast lump(s), breast pain and breast self exam. Associated symptoms include abnormal vaginal bleeding. Pertinent negatives include abnormal bleeding (hematology), anxiety, decreased libido, depression, difficulty falling sleep, dyspareunia, history of infertility, nocturia, sexual dysfunction, sleep disturbances, urinary incontinence, urinary urgency, vaginal discharge and vaginal itching. Diet regular.The patient states her exercise  level is    . The patient's tobacco use is:  Social History   Tobacco Use  Smoking Status Former Smoker  . Packs/day: 0.50  . Years: 20.00  . Pack years: 10.00  . Types: Cigarettes  . Quit date: 03/12/2019  . Years since quitting: 1.7  Smokeless Tobacco Never Used  Tobacco Comment   She quit in June  . She has  been exposed to passive smoke. The patient's alcohol use is:  Social History   Substance and Sexual Activity  Alcohol Use Never    Review of Systems  Constitutional: Negative.   HENT: Negative.   Eyes: Negative.  Negative for blurred vision.  Respiratory: Negative.  Negative for shortness of breath.   Cardiovascular: Negative.  Negative for chest pain and palpitations.  Gastrointestinal: Negative.   Endocrine: Negative.   Genitourinary: Negative.   Musculoskeletal: Negative.   Skin: Negative.   Allergic/Immunologic: Negative.   Neurological: Negative.   Hematological: Negative.   Psychiatric/Behavioral: Negative.   All other systems reviewed and are negative.    Today's Vitals   12/01/20 1436  BP: 110/74  Pulse: 76  Temp: 98.5 F (36.9 C)  TempSrc: Oral  Weight: 239 lb 6.4 oz (108.6 kg)  Height: 5' 7.2" (1.707 m)   Body mass index is 37.27 kg/m.  Wt Readings from Last 3 Encounters:  12/01/20 239 lb 6.4 oz (108.6 kg)  05/27/20 240 lb 3.2 oz (109 kg)  03/24/20 240 lb 12.8 oz (109.2 kg)   Objective:  Physical Exam Vitals and nursing note reviewed. Exam conducted with a chaperone present.  Constitutional:      General: She is not in acute distress.    Appearance: Normal appearance. She is well-developed. She is obese.  HENT:     Head: Normocephalic and atraumatic.     Right Ear: Hearing, tympanic membrane, ear canal and external ear normal. There is no impacted cerumen.     Left Ear: Hearing, tympanic membrane, ear canal and external ear normal. There is no impacted cerumen.     Nose: Nose normal.     Mouth/Throat:     Mouth: Mucous membranes are dry.  Eyes:     General: Lids are normal.     Extraocular Movements: Extraocular movements intact.     Conjunctiva/sclera: Conjunctivae normal.     Pupils: Pupils are equal, round, and reactive to light.     Funduscopic exam:    Right eye: No papilledema.        Left eye: No papilledema.  Neck:     Thyroid: No  thyroid mass.     Vascular: No carotid bruit.  Cardiovascular:     Rate and Rhythm: Normal rate and regular rhythm.     Pulses: Normal pulses.     Heart sounds: Normal heart sounds. No murmur heard.   Pulmonary:     Effort: Pulmonary effort is normal.     Breath sounds: Normal breath sounds.  Chest:  Breasts:     Tanner Score is 5.     Right: Normal.     Left: Normal.    Abdominal:     General: Abdomen is flat. Bowel sounds are normal. There is no distension.     Palpations: Abdomen is soft.     Tenderness: There is no abdominal tenderness.     Hernia: There is no hernia in the left inguinal area or right inguinal area.  Genitourinary:    General: Normal vulva.     Exam position:  Lithotomy position.     Tanner stage (genital): 5.     Vagina: Normal.     Cervix: Normal.     Uterus: Normal.      Adnexa: Right adnexa normal and left adnexa normal.     Rectum: Normal. Guaiac result negative.  Musculoskeletal:        General: No swelling. Normal range of motion.     Cervical back: Full passive range of motion without pain, normal range of motion and neck supple.     Right lower leg: No edema.     Left lower leg: No edema.  Lymphadenopathy:     Lower Body: No right inguinal adenopathy. No left inguinal adenopathy.  Skin:    General: Skin is warm and dry.     Capillary Refill: Capillary refill takes less than 2 seconds.  Neurological:     General: No focal deficit present.     Mental Status: She is alert and oriented to person, place, and time.     Cranial Nerves: No cranial nerve deficit.     Sensory: No sensory deficit.  Psychiatric:        Mood and Affect: Mood normal.        Behavior: Behavior normal.        Thought Content: Thought content normal.        Judgment: Judgment normal.         Assessment And Plan:     1. Routine general medical examination at health care facility Comments: A full exam was performed. Importance of monthly self breast exams was  discussed with the patient. PATIENT IS ADVISED TO GET 30-45 MINUTES REGULAR EXERCISE NO LESS THAN FOUR TO FIVE DAYS PER WEEK - BOTH WEIGHTBEARING EXERCISES AND AEROBIC ARE RECOMMENDED.  PATIENT IS ADVISED TO FOLLOW A HEALTHY DIET WITH AT LEAST SIX FRUITS/VEGGIES PER DAY, DECREASE INTAKE OF RED MEAT, AND TO INCREASE FISH INTAKE TO TWO DAYS PER WEEK.  MEATS/FISH SHOULD NOT BE FRIED, BAKED OR BROILED IS PREFERABLE.  I SUGGEST WEARING SPF 50 SUNSCREEN ON EXPOSED PARTS AND ESPECIALLY WHEN IN THE DIRECT SUNLIGHT FOR AN EXTENDED PERIOD OF TIME.  PLEASE AVOID FAST FOOD RESTAURANTS AND INCREASE YOUR WATER INTAKE.  - CBC - CMP14+EGFR - Lipid panel  2. Encounter for gynecological examination without abnormal finding Comments: Pap smear performed. Stool heme negative.She is advised that her next pap will be in 3-5 years if normal.  - Cytology -Pap Smear - POC Hemoccult Bld/Stl (1-Cd Office Dx)  3. Essential hypertension, benign Comments: Chronic, well controlled. She will continue with telmisartan/HCTz daily.  She is encouraged to avoid adding salt to her foods. EKG performed, NSR w/o acute changes. She will f/u in 6 months for f/u.  - POCT Urinalysis Dipstick (81002) - POCT UA - Microalbumin - EKG 12-Lead  4. Vitamin D deficiency disease Comments: I will check vitamin D level and supplement as needed.  - VITAMIN D 25 Hydroxy (Vit-D Deficiency, Fractures)  5. Class 2 severe obesity due to excess calories with serious comorbidity and body mass index (BMI) of 37.0 to 37.9 in adult [facility]) Comments: She is encouraged to strive for BMI less than 30 to decrease cardiac risk. Advised to aim for at least 150 minutes of exercise per week.   6. Encounter for counseling regarding contraception Comments: I will switch her to Micronor. Pt advised she should come off of estrogencontaining products if possible. She is aware she will start when she runs out - norethindrone [facility])  0.35 MG tablet; Take 1  tablet (0.35 mg total) by mouth daily.  Dispense: 28 tablet; Refill: 11  Patient was given opportunity to ask questions. Patient verbalized understanding of the plan and was able to repeat key elements of the plan. All questions were answered to their satisfaction.   I, Maximino Greenland, MD, have reviewed all documentation for this visit. The documentation on 12/01/20 for the exam, diagnosis, procedures, and orders are all accurate and complete.  THE PATIENT IS ENCOURAGED TO PRACTICE SOCIAL DISTANCING DUE TO THE COVID-19 PANDEMIC.

## 2020-12-01 NOTE — Patient Instructions (Signed)
Health Maintenance, Female Adopting a healthy lifestyle and getting preventive care are important in promoting health and wellness. Ask your health care provider about:  The right schedule for you to have regular tests and exams.  Things you can do on your own to prevent diseases and keep yourself healthy. What should I know about diet, weight, and exercise? Eat a healthy diet  Eat a diet that includes plenty of vegetables, fruits, low-fat dairy products, and lean protein.  Do not eat a lot of foods that are high in solid fats, added sugars, or sodium.   Maintain a healthy weight Body mass index (BMI) is used to identify weight problems. It estimates body fat based on height and weight. Your health care provider can help determine your BMI and help you achieve or maintain a healthy weight. Get regular exercise Get regular exercise. This is one of the most important things you can do for your health. Most adults should:  Exercise for at least 150 minutes each week. The exercise should increase your heart rate and make you sweat (moderate-intensity exercise).  Do strengthening exercises at least twice a week. This is in addition to the moderate-intensity exercise.  Spend less time sitting. Even light physical activity can be beneficial. Watch cholesterol and blood lipids Have your blood tested for lipids and cholesterol at 59 years of age, then have this test every 5 years. Have your cholesterol levels checked more often if:  Your lipid or cholesterol levels are high.  You are older than 59 years of age.  You are at high risk for heart disease. What should I know about cancer screening? Depending on your health history and family history, you may need to have cancer screening at various ages. This may include screening for:  Breast cancer.  Cervical cancer.  Colorectal cancer.  Skin cancer.  Lung cancer. What should I know about heart disease, diabetes, and high blood  pressure? Blood pressure and heart disease  High blood pressure causes heart disease and increases the risk of stroke. This is more likely to develop in people who have high blood pressure readings, are of African descent, or are overweight.  Have your blood pressure checked: ? Every 3-5 years if you are 18-39 years of age. ? Every year if you are 40 years old or older. Diabetes Have regular diabetes screenings. This checks your fasting blood sugar level. Have the screening done:  Once every three years after age 40 if you are at a normal weight and have a low risk for diabetes.  More often and at a younger age if you are overweight or have a high risk for diabetes. What should I know about preventing infection? Hepatitis B If you have a higher risk for hepatitis B, you should be screened for this virus. Talk with your health care provider to find out if you are at risk for hepatitis B infection. Hepatitis C Testing is recommended for:  Everyone born from 1945 through 1965.  Anyone with known risk factors for hepatitis C. Sexually transmitted infections (STIs)  Get screened for STIs, including gonorrhea and chlamydia, if: ? You are sexually active and are younger than 59 years of age. ? You are older than 59 years of age and your health care provider tells you that you are at risk for this type of infection. ? Your sexual activity has changed since you were last screened, and you are at increased risk for chlamydia or gonorrhea. Ask your health care provider   if you are at risk.  Ask your health care provider about whether you are at high risk for HIV. Your health care provider may recommend a prescription medicine to help prevent HIV infection. If you choose to take medicine to prevent HIV, you should first get tested for HIV. You should then be tested every 3 months for as long as you are taking the medicine. Pregnancy  If you are about to stop having your period (premenopausal) and  you may become pregnant, seek counseling before you get pregnant.  Take 400 to 800 micrograms (mcg) of folic acid every day if you become pregnant.  Ask for birth control (contraception) if you want to prevent pregnancy. Osteoporosis and menopause Osteoporosis is a disease in which the bones lose minerals and strength with aging. This can result in bone fractures. If you are 65 years old or older, or if you are at risk for osteoporosis and fractures, ask your health care provider if you should:  Be screened for bone loss.  Take a calcium or vitamin D supplement to lower your risk of fractures.  Be given hormone replacement therapy (HRT) to treat symptoms of menopause. Follow these instructions at home: Lifestyle  Do not use any products that contain nicotine or tobacco, such as cigarettes, e-cigarettes, and chewing tobacco. If you need help quitting, ask your health care provider.  Do not use street drugs.  Do not share needles.  Ask your health care provider for help if you need support or information about quitting drugs. Alcohol use  Do not drink alcohol if: ? Your health care provider tells you not to drink. ? You are pregnant, may be pregnant, or are planning to become pregnant.  If you drink alcohol: ? Limit how much you use to 0-1 drink a day. ? Limit intake if you are breastfeeding.  Be aware of how much alcohol is in your drink. In the [place] one drink equals one 12 oz bottle of beer (355 mL), one 5 oz glass of wine (148 mL), or one 1 oz glass of hard liquor (44 mL). General instructions  Schedule regular health, dental, and eye exams.  Stay current with your vaccines.  Tell your health care provider if: ? You often feel depressed. ? You have ever been abused or do not feel safe at home. Summary  Adopting a healthy lifestyle and getting preventive care are important in promoting health and wellness.  Follow your health care provider's instructions about healthy  diet, exercising, and getting tested or screened for diseases.  Follow your health care provider's instructions on monitoring your cholesterol and blood pressure. This information is not intended to replace advice given to you by your health care provider. Make sure you discuss any questions you have with your health care provider. Document Revised: 09/20/2018 Document Reviewed: 09/20/2018 Elsevier Patient Education  2021 Elsevier Inc.  

## 2020-12-02 LAB — CMP14+EGFR
ALT: 19 IU/L (ref 0–32)
AST: 18 IU/L (ref 0–40)
Albumin/Globulin Ratio: 1.3 (ref 1.2–2.2)
Albumin: 3.8 g/dL (ref 3.8–4.9)
Alkaline Phosphatase: 55 IU/L (ref 44–121)
BUN/Creatinine Ratio: 18 (ref 9–23)
BUN: 16 mg/dL (ref 6–24)
Bilirubin Total: 0.2 mg/dL (ref 0.0–1.2)
CO2: 23 mmol/L (ref 20–29)
Calcium: 9.2 mg/dL (ref 8.7–10.2)
Chloride: 103 mmol/L (ref 96–106)
Creatinine, Ser: 0.91 mg/dL (ref 0.57–1.00)
GFR calc Af Amer: 80 mL/min/{1.73_m2} (ref >59)
GFR calc non Af Amer: 70 mL/min/{1.73_m2} (ref >59)
Globulin, Total: 2.9 g/dL (ref 1.5–4.5)
Glucose: 77 mg/dL (ref 65–99)
Potassium: 3.8 mmol/L (ref 3.5–5.2)
Sodium: 141 mmol/L (ref 134–144)
Total Protein: 6.7 g/dL (ref 6.0–8.5)

## 2020-12-02 LAB — CBC
Hematocrit: 37.3 % (ref 34.0–46.6)
Hemoglobin: 12.3 g/dL (ref 11.1–15.9)
MCH: 30.1 pg (ref 26.6–33.0)
MCHC: 33 g/dL (ref 31.5–35.7)
MCV: 91 fL (ref 79–97)
Platelets: 284 10*3/uL (ref 150–450)
RBC: 4.09 x10E6/uL (ref 3.77–5.28)
RDW: 12.6 % (ref 11.7–15.4)
WBC: 5.5 10*3/uL (ref 3.4–10.8)

## 2020-12-02 LAB — LIPID PANEL
Chol/HDL Ratio: 3.5 ratio (ref 0.0–4.4)
Cholesterol, Total: 156 mg/dL (ref 100–199)
HDL: 45 mg/dL (ref >39)
LDL Chol Calc (NIH): 92 mg/dL (ref 0–99)
Triglycerides: 105 mg/dL (ref 0–149)
VLDL Cholesterol Cal: 19 mg/dL (ref 5–40)

## 2020-12-02 LAB — VITAMIN D 25 HYDROXY (VIT D DEFICIENCY, FRACTURES): Vit D, 25-Hydroxy: 46.8 ng/mL (ref 30.0–100.0)

## 2020-12-03 LAB — CYTOLOGY - PAP: Diagnosis: NEGATIVE

## 2020-12-15 ENCOUNTER — Other Ambulatory Visit: Payer: Self-pay

## 2020-12-17 ENCOUNTER — Encounter: Payer: Self-pay | Admitting: Internal Medicine

## 2020-12-24 ENCOUNTER — Other Ambulatory Visit: Payer: Self-pay | Admitting: Internal Medicine

## 2021-01-22 ENCOUNTER — Telehealth: Payer: Self-pay

## 2021-01-22 NOTE — Telephone Encounter (Signed)
The pt said that her cycle hasn't come on yet and that she's hot several times per day and she didn't have any problem since starting her new birth control.  The pt was asked if she wanted a GYN Referrel, the pt said she will be in for an appt on next week and will discuss it then.

## 2021-01-29 ENCOUNTER — Other Ambulatory Visit: Payer: Self-pay

## 2021-01-29 ENCOUNTER — Encounter: Payer: Self-pay | Admitting: Internal Medicine

## 2021-01-29 ENCOUNTER — Ambulatory Visit: Payer: BC Managed Care – PPO | Admitting: Internal Medicine

## 2021-01-29 VITALS — BP 120/82 | HR 86 | Temp 99.0°F | Ht 67.2 in | Wt 242.0 lb

## 2021-01-29 DIAGNOSIS — N951 Menopausal and female climacteric states: Secondary | ICD-10-CM

## 2021-01-29 DIAGNOSIS — Z3009 Encounter for other general counseling and advice on contraception: Secondary | ICD-10-CM

## 2021-01-29 DIAGNOSIS — B353 Tinea pedis: Secondary | ICD-10-CM

## 2021-01-29 MED ORDER — NYSTATIN 100000 UNIT/GM EX CREA: 1.0000 "application " | TOPICAL_CREAM | Freq: Two times a day (BID) | CUTANEOUS | 0 refills | Status: DC

## 2021-01-29 MED ORDER — LO LOESTRIN FE 1 MG-10 MCG / 10 MCG PO TABS: 1.0000 | ORAL_TABLET | Freq: Every day | ORAL | 2 refills | Status: DC

## 2021-01-29 NOTE — Patient Instructions (Addendum)
[employer] Textbook of Endocrinology (14th ed., pp. 574-641). [place], [place]: Elsevier.">  Perimenopause Perimenopause is the normal time of a woman's life when the levels of estrogen, the female hormone produced by the ovaries, begin to decrease. This leads to changes in menstrual periods before they stop completely (menopause). Perimenopause can begin 2-8 years before menopause. During perimenopause, the ovaries may or may not produce an egg and a woman can still become pregnant. What are the causes? This condition is caused by a natural change in hormone levels that happens as you get older. What increases the risk? This condition is more likely to start at an earlier age if you have certain medical conditions or have undergone treatments, including:  A tumor of the pituitary gland in the brain.  A disease that affects the ovaries and hormone production.  Certain cancer treatments, such as chemotherapy or hormone therapy, or radiation therapy on the pelvis.  Heavy smoking and excessive alcohol use.  Family history of early menopause. What are the signs or symptoms? Perimenopausal changes affect each woman differently. Symptoms of this condition may include:  Hot flashes.  Irregular menstrual periods.  Night sweats.  Changes in feelings about sex. This could be a decrease in sex drive or an increased discomfort around your sexuality.  Vaginal dryness.  Headaches.  Mood swings.  Depression.  Problems sleeping (insomnia).  Memory problems or trouble concentrating.  Irritability.  Tiredness.  Weight gain.  Anxiety.  Trouble getting pregnant. How is this diagnosed? This condition is diagnosed based on your medical history, a physical exam, your age, your menstrual history, and your symptoms. Hormone tests may also be done. How is this treated? In some cases, no treatment is needed. You and your health care provider should make a decision together about whether  treatment is necessary. Treatment will be based on your individual condition and preferences. Various treatments are available, such as:  Menopausal hormone therapy (MHT).  Medicines to treat specific symptoms.  Acupuncture.  Vitamin or herbal supplements. Before starting treatment, make sure to let your health care provider know if you have a personal or family history of:  Heart disease.  Breast cancer.  Blood clots.  Diabetes.  Osteoporosis. Follow these instructions at home: Medicines  Take over-the-counter and prescription medicines only as told by your health care provider.  Take vitamin supplements only as told by your health care provider.  Talk with your health care provider before starting any herbal supplements. Lifestyle  Do not use any products that contain nicotine or tobacco, such as cigarettes, e-cigarettes, and chewing tobacco. If you need help quitting, ask your health care provider.  Get at least 30 minutes of physical activity on 5 or more days each week.  Eat a balanced diet that includes fresh fruits and vegetables, whole grains, soybeans, eggs, lean meat, and low-fat dairy.  Avoid alcoholic and caffeinated beverages, as well as spicy foods. This may help prevent hot flashes.  Get 7-8 hours of sleep each night.  Dress in layers that can be removed to help you manage hot flashes.  Find ways to manage stress, such as deep breathing, meditation, or journaling.   General instructions  Keep track of your menstrual periods, including: ? When they occur. ? How heavy they are and how long they last. ? How much time passes between periods.  Keep track of your symptoms, noting when they start, how often you have them, and how long they last.  Use vaginal lubricants or moisturizers to help   with vaginal dryness and improve comfort during sex.  You can still become pregnant if you are having irregular periods. Make sure you use contraception during  perimenopause if you do not want to get pregnant.  Keep all follow-up visits. This is important. This includes any group therapy or counseling.   Contact a health care provider if:  You have heavy vaginal bleeding or pass blood clots.  Your period lasts more than 2 days longer than normal.  Your periods are recurring sooner than 21 days.  You bleed after having sex.  You have pain during sex. Get help right away if you have:  Chest pain, trouble breathing, or trouble talking.  Severe depression.  Pain when you urinate.  Severe headaches.  Vision problems. Summary  Perimenopause is the time when a woman's body begins to move into menopause. This may happen naturally or as a result of other health problems or medical treatments.  Perimenopause can begin 2-8 years before menopause, and it can last for several years.  Perimenopausal symptoms can be managed through medicines, lifestyle changes, and complementary therapies such as acupuncture. This information is not intended to replace advice given to you by your health care provider. Make sure you discuss any questions you have with your health care provider. Document Revised: 03/13/2020 Document Reviewed: 03/13/2020 Elsevier Patient Education  2021 Elsevier Inc.  

## 2021-01-29 NOTE — Progress Notes (Signed)
I,Katawbba Wiggins,acting as a Neurosurgeon for Gwynneth Aliment, MD.,have documented all relevant documentation on the behalf of Gwynneth Aliment, MD,as directed by  Gwynneth Aliment, MD while in the presence of Gwynneth Aliment, MD.  This visit occurred during the SARS-CoV-2 public health emergency.  Safety protocols were in place, including screening questions prior to the visit, additional usage of staff PPE, and extensive cleaning of exam room while observing appropriate contact time as indicated for disinfecting solutions.  Subjective:     Patient ID: Sara Short , female    DOB: 11-05-1960 , 60 y.o.   MRN: [id number]   Chief Complaint  Patient presents with  . Contraception    HPI  The patient is here today for a follow-up on her birth control. She feels that the new rx, Micronor has caused her to have hot flashes. She wants to switch to another medication. She is also concerned because she did not "have a regular cycle", only spotting.     Past Medical History:  Diagnosis Date  . Allergic rhinitis   . HTN (hypertension)   . Obesity      Family History  Problem Relation Age of Onset  . Diverticulitis Mother   . Hypertension Mother   . Diabetes Father      Current Outpatient Medications:  .  Chlorpheniramine Maleate (ALLERGY PO), Take by mouth., Disp: , Rfl:  .  Cholecalciferol (VITAMIN D3) 25 MCG (1000 UT) CAPS, Take 2 capsules (2,000 Units total) by mouth daily. 1 per day, Disp: 60 capsule, Rfl: 1 .  fluconazole (DIFLUCAN) 150 MG tablet, One tab po today, and repeat on Wednesday. Please hold simvastatin until Th, Disp: 2 tablet, Rfl: 0 .  Magnesium 400 MG CAPS, Take 400 mg by mouth Nightly., Disp: 90 capsule, Rfl: 1 .  meloxicam (MOBIC) 15 MG tablet, Take 1 tablet (15 mg total) by mouth daily., Disp: 30 tablet, Rfl: 0 .  Multiple Vitamin (MULTIVITAMIN) capsule, Take 1 capsule by mouth daily., Disp: , Rfl:  .  Norethindrone-Ethinyl Estradiol-Fe Biphas (LO LOESTRIN FE)  1 MG-10 MCG / 10 MCG tablet, Take 1 tablet by mouth daily., Disp: 28 tablet, Rfl: 2 .  nystatin (NYSTATIN) powder, Apply 1 application topically 3 (three) times daily., Disp: 60 g, Rfl: 0 .  nystatin cream (MYCOSTATIN), Apply 1 application topically 2 (two) times daily., Disp: 30 g, Rfl: 0 .  simvastatin (ZOCOR) 20 MG tablet, TAKE 1 TABLET(20 MG) BY MOUTH AT BEDTIME, Disp: 90 tablet, Rfl: 2 .  telmisartan-hydrochlorothiazide (MICARDIS HCT) 80-25 MG tablet, TAKE 1 TABLET BY MOUTH DAILY, Disp: 90 tablet, Rfl: 1 .  alclomethasone (ACLOVATE) 0.05 % cream, Apply topically 2 (two) times daily as needed. (Patient not taking: Reported on 01/29/2021), Disp: , Rfl:    Allergies  Allergen Reactions  . Crestor [Rosuvastatin Calcium] Hives  . Lipitor [Atorvastatin Calcium] Hives     Review of Systems  Constitutional: Negative.   Respiratory: Negative.   Cardiovascular: Negative.   Gastrointestinal: Negative.   Skin:       She c/o itching of left foot. States she wears heavy boots at work, feels her feet sweat.   Psychiatric/Behavioral: Negative.   All other systems reviewed and are negative.    Today's Vitals   01/29/21 1019  BP: 120/82  Pulse: 86  Temp: 99 F (37.2 C)  TempSrc: Oral  Weight: 242 lb (109.8 kg)  Height: 5' 7.2" (1.707 m)   Body mass index is 37.68 kg/m.  Wt  Readings from Last 3 Encounters:  01/29/21 242 lb (109.8 kg)  12/01/20 239 lb 6.4 oz (108.6 kg)  05/27/20 240 lb 3.2 oz (109 kg)   Objective:  Physical Exam Vitals and nursing note reviewed.  Constitutional:      Appearance: Normal appearance.  HENT:     Head: Normocephalic and atraumatic.     Nose:     Comments: Masked     Mouth/Throat:     Comments: Masked  Cardiovascular:     Rate and Rhythm: Normal rate and regular rhythm.     Heart sounds: Normal heart sounds.  Pulmonary:     Effort: Pulmonary effort is normal.     Breath sounds: Normal breath sounds.  Musculoskeletal:     Cervical back: Normal  range of motion.  Skin:    General: Skin is warm.  Neurological:     General: No focal deficit present.     Mental Status: She is alert.  Psychiatric:        Mood and Affect: Mood normal.        Behavior: Behavior normal.         Assessment And Plan:     1. Encounter for counseling regarding contraception Comments: She does not wish to continue with current ocp. She wants to resume one with estrogen. I will start one with low dose estrogen. She agrees to f/u 6 wks.   2. Perimenopause Comments: Common sx were discussed in detail with the patient. She was given information to read on her own as well.   3. Tinea pedis of left foot Comments: She will use existing rx for nystatin powder daily prior to work, and I will send rx nystatin cream for her to use prior to going to bed.    Patient was given opportunity to ask questions. Patient verbalized understanding of the plan and was able to repeat key elements of the plan. All questions were answered to their satisfaction.   I, Gwynneth Aliment, MD, have reviewed all documentation for this visit. The documentation on 01/29/21 for the exam, diagnosis, procedures, and orders are all accurate and complete.   IF YOU HAVE BEEN REFERRED TO A SPECIALIST, IT MAY TAKE 1-2 WEEKS TO SCHEDULE/PROCESS THE REFERRAL. IF YOU HAVE NOT HEARD FROM US/SPECIALIST IN TWO WEEKS, PLEASE GIVE [name] A CALL AT [phone].   THE PATIENT IS ENCOURAGED TO PRACTICE SOCIAL DISTANCING DUE TO THE COVID-19 PANDEMIC.

## 2021-02-26 ENCOUNTER — Telehealth: Payer: Self-pay

## 2021-02-26 NOTE — Telephone Encounter (Signed)
I left the pt a message that I was returning her call to schddule her an appt for the swelling in her legs and ankles.

## 2021-02-26 NOTE — Telephone Encounter (Signed)
The pt was called to schedule her an appt for swelling in her legs and ankles.  The pt said she will keep her legs elevated and stay away from salt to see if maybe that is the problem.  The pt said that she wore some compression hoses to work and that seemed to help some.

## 2021-03-02 ENCOUNTER — Other Ambulatory Visit: Payer: Self-pay | Admitting: Internal Medicine

## 2021-03-06 ENCOUNTER — Other Ambulatory Visit: Payer: Self-pay | Admitting: Internal Medicine

## 2021-04-15 ENCOUNTER — Other Ambulatory Visit: Payer: Self-pay | Admitting: Internal Medicine

## 2021-04-22 ENCOUNTER — Other Ambulatory Visit: Payer: Self-pay | Admitting: Internal Medicine

## 2021-05-25 ENCOUNTER — Encounter: Payer: Self-pay | Admitting: Internal Medicine

## 2021-06-01 ENCOUNTER — Other Ambulatory Visit: Payer: Self-pay

## 2021-06-01 ENCOUNTER — Ambulatory Visit: Payer: BC Managed Care – PPO | Admitting: Internal Medicine

## 2021-06-01 ENCOUNTER — Other Ambulatory Visit: Payer: Self-pay | Admitting: Internal Medicine

## 2021-06-01 VITALS — BP 124/70 | HR 97 | Temp 99.5°F | Ht 68.0 in | Wt 235.8 lb

## 2021-06-01 DIAGNOSIS — I10 Essential (primary) hypertension: Secondary | ICD-10-CM | POA: Diagnosis not present

## 2021-06-01 DIAGNOSIS — R202 Paresthesia of skin: Secondary | ICD-10-CM

## 2021-06-01 DIAGNOSIS — R509 Fever, unspecified: Secondary | ICD-10-CM | POA: Diagnosis not present

## 2021-06-01 DIAGNOSIS — Z6835 Body mass index (BMI) 35.0-35.9, adult: Secondary | ICD-10-CM

## 2021-06-01 DIAGNOSIS — R3129 Other microscopic hematuria: Secondary | ICD-10-CM

## 2021-06-01 DIAGNOSIS — M5442 Lumbago with sciatica, left side: Secondary | ICD-10-CM

## 2021-06-01 MED ORDER — CYCLOBENZAPRINE HCL 5 MG PO TABS: ORAL_TABLET | ORAL | 0 refills | Status: DC

## 2021-06-01 NOTE — Progress Notes (Signed)
I,Tianna Badgett,acting as a Education administrator for Maximino Greenland, MD.,have documented all relevant documentation on the behalf of Maximino Greenland, MD,as directed by  Maximino Greenland, MD while in the presence of Maximino Greenland, MD.  This visit occurred during the SARS-CoV-2 public health emergency.  Safety protocols were in place, including screening questions prior to the visit, additional usage of staff PPE, and extensive cleaning of exam room while observing appropriate contact time as indicated for disinfecting solutions.  Subjective:     Patient ID: Sara Short , female    DOB: Oct 11, 1961 , 60 y.o.   MRN: [id number]   Chief Complaint  Patient presents with   Hypertension    HPI  She presents today for BP check. She reports compliance with meds. She denies headaches, chest pain and shortness of breath.   Hypertension This is a chronic problem. The current episode started more than 1 year ago. The problem has been gradually improving since onset. The problem is uncontrolled. Pertinent negatives include no blurred vision, chest pain, palpitations or shortness of breath. Risk factors for coronary artery disease include obesity, post-menopausal state and sedentary lifestyle. The current treatment provides moderate improvement. Compliance problems include exercise.     Past Medical History:  Diagnosis Date   Allergic rhinitis    HTN (hypertension)    Obesity      Family History  Problem Relation Age of Onset   Diverticulitis Mother    Hypertension Mother    Diabetes Father      Current Outpatient Medications:    Chlorpheniramine Maleate (ALLERGY PO), Take by mouth., Disp: , Rfl:    Cholecalciferol (VITAMIN D3) 25 MCG (1000 UT) CAPS, Take 2 capsules (2,000 Units total) by mouth daily. 1 per day, Disp: 60 capsule, Rfl: 1   cyclobenzaprine (FLEXERIL) 5 MG tablet, One tab twice daily as needed, do not drive while on this medication, Disp: 30 tablet, Rfl: 0   LO LOESTRIN FE 1 MG-10  MCG / 10 MCG tablet, TAKE 1 TABLET BY MOUTH DAILY, Disp: 28 tablet, Rfl: 2   Magnesium 400 MG CAPS, Take 400 mg by mouth Nightly., Disp: 90 capsule, Rfl: 1   Multiple Vitamin (MULTIVITAMIN) capsule, Take 1 capsule by mouth daily., Disp: , Rfl:    nystatin (NYSTATIN) powder, Apply 1 application topically 3 (three) times daily., Disp: 60 g, Rfl: 0   simvastatin (ZOCOR) 20 MG tablet, TAKE 1 TABLET(20 MG) BY MOUTH AT BEDTIME, Disp: 90 tablet, Rfl: 2   telmisartan-hydrochlorothiazide (MICARDIS HCT) 80-25 MG tablet, TAKE 1 TABLET BY MOUTH DAILY, Disp: 90 tablet, Rfl: 1   nystatin cream (MYCOSTATIN), APPLY TOPICALLY TO THE AFFECTED AREA TWICE DAILY, Disp: 30 g, Rfl: 0   Allergies  Allergen Reactions   Crestor [Rosuvastatin Calcium] Hives   Lipitor [Atorvastatin Calcium] Hives     Review of Systems  Constitutional: Negative.   Eyes:  Negative for blurred vision.  Respiratory: Negative.  Negative for shortness of breath.   Cardiovascular: Negative.  Negative for chest pain and palpitations.  Gastrointestinal: Negative.   Musculoskeletal:  Positive for myalgias.  Neurological:  Positive for numbness.    Today's Vitals   06/01/21 1015  BP: 124/70  Pulse: 97  Temp: 99.5 F (37.5 C)  TempSrc: Oral  Weight: 235 lb 12.8 oz (107 kg)  Height: 5' 8"  (1.727 m)   Body mass index is 35.85 kg/m.  Wt Readings from Last 3 Encounters:  06/01/21 235 lb 12.8 oz (107 kg)  01/29/21 242  lb (109.8 kg)  12/01/20 239 lb 6.4 oz (108.6 kg)    Objective:  Physical Exam Vitals and nursing note reviewed.  Constitutional:      Appearance: Normal appearance.  HENT:     Head: Normocephalic and atraumatic.     Nose:     Comments: Masked     Mouth/Throat:     Comments: Masked  Eyes:     Extraocular Movements: Extraocular movements intact.  Cardiovascular:     Rate and Rhythm: Normal rate and regular rhythm.     Heart sounds: Normal heart sounds.  Pulmonary:     Effort: Pulmonary effort is normal.      Breath sounds: Normal breath sounds.  Musculoskeletal:     Cervical back: Normal range of motion.     Comments: Neg Phalen's/Tinel's sign  Skin:    General: Skin is warm.  Neurological:     General: No focal deficit present.     Mental Status: She is alert.  Psychiatric:        Mood and Affect: Mood normal.        Behavior: Behavior normal.        Assessment And Plan:     1. Essential hypertension, benign Comments: Chronic, well controlled. Encouraged to follow low sodium diet. I will check renal function today.  - CMP14+EGFR - Insulin, random(561)  2. Fever, low grade Comments: Associated w/ body aches. She agrees to COVID testing. If POC testing is neg, will check PCR testing.  - POC COVID-19 - Novel Coronavirus, NAA ([facility])  3. Paresthesia of both hands Comments: I will check labs as listed below. I will make further recommendations once her labs are available for review.  - Vitamin B12 - TSH  4. Acute left-sided low back pain with left-sided sciatica Comments: She was given stretching exercises to perform. She was also given rx cyclobenzaprine to use nightly prn.  - POCT Urinalysis Dipstick (81002)  5. Microscopic hematuria Comments: She agrees to renal u/s.  - US Renal; Future  6. Class 2 severe obesity due to excess calories with serious comorbidity and body mass index (BMI) of 35.0 to 35.9 in adult [facility])  She is encouraged to strive for BMI less than 30 to decrease cardiac risk. Advised to aim for at least 150 minutes of exercise per week.  Patient was given opportunity to ask questions. Patient verbalized understanding of the plan and was able to repeat key elements of the plan. All questions were answered to their satisfaction.   I, Maximino Greenland, MD, have reviewed all documentation for this visit. The documentation on 06/06/21 for the exam, diagnosis, procedures, and orders are all accurate and complete.   IF YOU HAVE BEEN REFERRED TO A SPECIALIST, IT MAY  TAKE 1-2 WEEKS TO SCHEDULE/PROCESS THE REFERRAL. IF YOU HAVE NOT HEARD FROM US/SPECIALIST IN TWO WEEKS, PLEASE GIVE [name] A CALL AT [phone].   THE PATIENT IS ENCOURAGED TO PRACTICE SOCIAL DISTANCING DUE TO THE COVID-19 PANDEMIC.

## 2021-06-01 NOTE — Patient Instructions (Signed)

## 2021-06-02 LAB — CMP14+EGFR
ALT: 26 IU/L (ref 0–32)
AST: 26 IU/L (ref 0–40)
Albumin/Globulin Ratio: 1.3 (ref 1.2–2.2)
Albumin: 3.9 g/dL (ref 3.8–4.9)
Alkaline Phosphatase: 61 IU/L (ref 44–121)
BUN/Creatinine Ratio: 18 (ref 9–23)
BUN: 15 mg/dL (ref 6–24)
Bilirubin Total: 0.3 mg/dL (ref 0.0–1.2)
CO2: 22 mmol/L (ref 20–29)
Calcium: 9.4 mg/dL (ref 8.7–10.2)
Chloride: 105 mmol/L (ref 96–106)
Creatinine, Ser: 0.85 mg/dL (ref 0.57–1.00)
Globulin, Total: 2.9 g/dL (ref 1.5–4.5)
Glucose: 94 mg/dL (ref 65–99)
Potassium: 4 mmol/L (ref 3.5–5.2)
Sodium: 141 mmol/L (ref 134–144)
Total Protein: 6.8 g/dL (ref 6.0–8.5)
eGFR: 79 mL/min/{1.73_m2} (ref >59)

## 2021-06-02 LAB — POCT URINALYSIS DIPSTICK
Bilirubin, UA: NEGATIVE
Glucose, UA: NEGATIVE
Ketones, UA: NEGATIVE
Leukocytes, UA: NEGATIVE
Nitrite, UA: NEGATIVE
Protein, UA: POSITIVE — AB
Spec Grav, UA: 1.025 (ref 1.010–1.025)
Urobilinogen, UA: 0.2 E.U./dL
pH, UA: 5.5 (ref 5.0–8.0)

## 2021-06-02 LAB — NOVEL CORONAVIRUS, NAA: SARS-CoV-2, NAA: NOT DETECTED

## 2021-06-02 LAB — INSULIN, RANDOM: INSULIN: 13.4 u[IU]/mL (ref 2.6–24.9)

## 2021-06-02 LAB — TSH: TSH: 1.98 u[IU]/mL (ref 0.450–4.500)

## 2021-06-02 LAB — VITAMIN B12: Vitamin B-12: 575 pg/mL (ref 232–1245)

## 2021-06-02 LAB — SARS-COV-2, NAA 2 DAY TAT

## 2021-06-05 ENCOUNTER — Ambulatory Visit
Admission: RE | Admit: 2021-06-05 | Discharge: 2021-06-05 | Disposition: A | Discharge status: Home / Self Care | Payer: BC Managed Care – PPO | Source: Ambulatory Visit | Attending: Internal Medicine | Admitting: Internal Medicine

## 2021-06-05 DIAGNOSIS — R3129 Other microscopic hematuria: Secondary | ICD-10-CM

## 2021-06-06 ENCOUNTER — Encounter: Payer: Self-pay | Admitting: Internal Medicine

## 2021-06-06 LAB — POC COVID19 BINAXNOW: SARS Coronavirus 2 Ag: NEGATIVE

## 2021-06-08 ENCOUNTER — Telehealth: Payer: Self-pay

## 2021-06-08 NOTE — Telephone Encounter (Signed)
The pt called to make sure she was reading her results correctly that her renal ultrasound is normal and the pt was told yes.

## 2021-06-16 ENCOUNTER — Other Ambulatory Visit: Payer: Self-pay

## 2021-06-16 ENCOUNTER — Telehealth: Payer: Self-pay

## 2021-06-16 DIAGNOSIS — M5442 Lumbago with sciatica, left side: Secondary | ICD-10-CM

## 2021-06-16 NOTE — Telephone Encounter (Signed)
The pt was noitified that she will geat a referral to orthopedic for evaluation of low back pain

## 2021-06-23 ENCOUNTER — Ambulatory Visit: Payer: Self-pay

## 2021-06-23 ENCOUNTER — Ambulatory Visit: Payer: BC Managed Care – PPO | Admitting: Physician Assistant

## 2021-06-23 DIAGNOSIS — M79605 Pain in left leg: Secondary | ICD-10-CM

## 2021-06-23 DIAGNOSIS — M25562 Pain in left knee: Secondary | ICD-10-CM | POA: Diagnosis not present

## 2021-06-23 DIAGNOSIS — M545 Low back pain, unspecified: Secondary | ICD-10-CM

## 2021-06-23 DIAGNOSIS — G8929 Other chronic pain: Secondary | ICD-10-CM

## 2021-06-23 MED ORDER — PREDNISONE 10 MG PO TABS: 20.0000 mg | ORAL_TABLET | Freq: Every day | ORAL | 1 refills | Status: DC

## 2021-06-23 NOTE — Progress Notes (Signed)
Office Visit Note   Patient: Sara Short           Date of Birth: 1961/02/09           MRN: [id number] Visit Date: 06/23/2021              Requested by: Dorothyann Peng, MD [address] [place],  [place] [address] PCP: Dorothyann Peng, MD  Chief Complaint  Patient presents with   Lower Back - Pain   Left Leg - Pain      HPI: Patient is a pleasant 60 year old woman with a history of lower buttock pain that radiates down her lateral side of her leg and knee.  She denies any injury but however she said this began when she was helping her family move some furniture.  She denies any paresthesias.  She was placed on muscle relaxers by her primary care physicians and she states that all of these did was make her sleepy.  She is not sure she thinks this is more back pain but cannot eliminate knee pain.  She denies any weakness.  Assessment & Plan: Visit Diagnoses:  1. Chronic pain of left knee   2. Pain in left leg     Plan: We will try her on a course of 20 mg of prednisone daily with food.  She will follow-up in 3 weeks if she does not have improvement I would recommend an MRI with possible epidural steroid injections.  Follow-Up Instructions: No follow-ups on file.   Ortho Exam  Patient is alert, oriented, no adenopathy, well-dressed, normal affect, normal respiratory effort. She has no tenderness specifically over the spine that when she gets tender is over her left buttock.  This does recreate some of her symptoms.  She has good dorsiflexion strength plantarflexion strength.  She has a mildly positive straight leg raise on the left.  With forward flexion she reproduces some of the pain.  She is okay on extension and side to side bending.  She does not walk with an antalgic gait.  Her knee has no effusion no tenderness over the medial lateral joint line.  She does have crepitus with range of motion she has good varus and valgus stressing stability  Imaging: No  results found. No images are attached to the encounter.  Labs: Lab Results  Component Value Date   HGBA1C 5.6 09/24/2019   HGBA1C 5.6 01/02/2019     Lab Results  Component Value Date   ALBUMIN 3.9 06/01/2021   ALBUMIN 3.8 12/01/2020   ALBUMIN 3.8 03/24/2020    No results found for: MG Lab Results  Component Value Date   VD25OH 46.8 12/01/2020   VD25OH 41.1 03/24/2020    No results found for: PREALBUMIN CBC EXTENDED Latest Ref Rng & Units 12/01/2020 09/24/2019  WBC 3.4 - 10.8 x10E3/uL 5.5 4.8  RBC 3.77 - 5.28 x10E6/uL 4.09 4.24  HGB 11.1 - 15.9 g/dL 53.2 99.2  HCT 42.6 - 83.4 % 37.3 37.9  PLT 150 - 450 x10E3/uL 284 271     There is no height or weight on file to calculate BMI.  Orders:  Orders Placed This Encounter  Procedures   XR Lumbar Spine 2-3 Views   XR Knee 1-2 Views Left   Meds ordered this encounter  Medications   predniSONE (DELTASONE) 10 MG tablet    Sig: Take 2 tablets (20 mg total) by mouth daily with breakfast.    Dispense:  60 tablet    Refill:  1     Procedures: No procedures performed  Clinical Data: No additional findings.  ROS:  All other systems negative, except as noted in the HPI. Review of Systems  Objective: Vital Signs: LMP 12/24/2020   Specialty Comments:  No specialty comments available.  PMFS History: Patient Active Problem List   Diagnosis Date Noted   Essential hypertension, benign 01/02/2019   Primary insomnia 01/02/2019   Past Medical History:  Diagnosis Date   Allergic rhinitis    HTN (hypertension)    Obesity     Family History  Problem Relation Age of Onset   Diverticulitis Mother    Hypertension Mother    Diabetes Father     Past Surgical History:  Procedure Laterality Date   ECTOPIC PREGNANCY SURGERY  2003   Social History   Occupational History   Not on file  Tobacco Use   Smoking status: Former    Packs/day: 0.50    Years: 20.00    Pack years: 10.00    Types: Cigarettes    Quit  date: 03/12/2019    Years since quitting: 2.2   Smokeless tobacco: Never   Tobacco comments:    She quit in June  Vaping Use   Vaping Use: Never used  Substance and Sexual Activity   Alcohol use: Never   Drug use: Never   Sexual activity: Not on file

## 2021-06-29 ENCOUNTER — Other Ambulatory Visit: Payer: Self-pay | Admitting: Internal Medicine

## 2021-06-29 DIAGNOSIS — Z1231 Encounter for screening mammogram for malignant neoplasm of breast: Secondary | ICD-10-CM

## 2021-07-02 ENCOUNTER — Other Ambulatory Visit: Payer: Self-pay | Admitting: Internal Medicine

## 2021-07-13 ENCOUNTER — Ambulatory Visit: Payer: BC Managed Care – PPO | Admitting: Orthopedic Surgery

## 2021-07-13 DIAGNOSIS — M25562 Pain in left knee: Secondary | ICD-10-CM | POA: Diagnosis not present

## 2021-07-13 DIAGNOSIS — M545 Low back pain, unspecified: Secondary | ICD-10-CM

## 2021-07-13 DIAGNOSIS — G8929 Other chronic pain: Secondary | ICD-10-CM | POA: Diagnosis not present

## 2021-07-13 DIAGNOSIS — M79605 Pain in left leg: Secondary | ICD-10-CM | POA: Diagnosis not present

## 2021-07-26 ENCOUNTER — Encounter: Payer: Self-pay | Admitting: Orthopedic Surgery

## 2021-07-26 NOTE — Progress Notes (Signed)
Office Visit Note   Patient: Sara Short           Date of Birth: [birth year]           MRN: [id number] Visit Date: 07/13/2021              Requested by: Dorothyann Peng, MD [address] [place],  [place] [address] PCP: Dorothyann Peng, MD  Chief Complaint  Patient presents with   Left Leg - Follow-up   Lower Back - Follow-up      HPI: Patient is a 60 year old woman with lower back pain radiating to the left lower extremity.  Patient has been treated with prednisone 20 mg with breakfast and she states that this has helped a lot.  She still has some left calf tenderness with prolonged sitting but states she is 100% better than before.  Assessment & Plan: Visit Diagnoses:  1. Chronic pain of left knee   2. Low back pain radiating to left leg     Plan: Patient will wean off the prednisone and we will renew this if needed.  Discussed that the symptoms worsen or fail to resolve we may need an MRI scan of her lumbar spine.  Follow-Up Instructions: Return if symptoms worsen or fail to improve.   Ortho Exam  Patient is alert, oriented, no adenopathy, well-dressed, normal affect, normal respiratory effort. Examination patient has a negative straight leg raise bilaterally motor strength is 5/5 in all motor groups of both lower extremities.  Her radicular pain starts in the left buttocks and radiates to the lateral left calf.  Imaging: No results found. No images are attached to the encounter.  Labs: Lab Results  Component Value Date   HGBA1C 5.6 09/24/2019   HGBA1C 5.6 01/02/2019     Lab Results  Component Value Date   ALBUMIN 3.9 06/01/2021   ALBUMIN 3.8 12/01/2020   ALBUMIN 3.8 03/24/2020    No results found for: MG Lab Results  Component Value Date   VD25OH 46.8 12/01/2020   VD25OH 41.1 03/24/2020    No results found for: PREALBUMIN CBC EXTENDED Latest Ref Rng & Units 12/01/2020 09/24/2019  WBC 3.4 - 10.8 x10E3/uL 5.5 4.8  RBC 3.77 - 5.28  x10E6/uL 4.09 4.24  HGB 11.1 - 15.9 g/dL 74.2 59.5  HCT 63.8 - 75.6 % 37.3 37.9  PLT 150 - 450 x10E3/uL 284 271     There is no height or weight on file to calculate BMI.  Orders:  No orders of the defined types were placed in this encounter.  No orders of the defined types were placed in this encounter.    Procedures: No procedures performed  Clinical Data: No additional findings.  ROS:  All other systems negative, except as noted in the HPI. Review of Systems  Objective: Vital Signs: LMP 12/24/2020   Specialty Comments:  No specialty comments available.  PMFS History: Patient Active Problem List   Diagnosis Date Noted   Essential hypertension, benign 01/02/2019   Primary insomnia 01/02/2019   Past Medical History:  Diagnosis Date   Allergic rhinitis    HTN (hypertension)    Obesity     Family History  Problem Relation Age of Onset   Diverticulitis Mother    Hypertension Mother    Diabetes Father     Past Surgical History:  Procedure Laterality Date   ECTOPIC PREGNANCY SURGERY  2003   Social History   Occupational History   Not on file  Tobacco  Use   Smoking status: Former    Packs/day: 0.50    Years: 20.00    Pack years: 10.00    Types: Cigarettes    Quit date: 03/12/2019    Years since quitting: 2.3   Smokeless tobacco: Never   Tobacco comments:    She quit in June  Vaping Use   Vaping Use: Never used  Substance and Sexual Activity   Alcohol use: Never   Drug use: Never   Sexual activity: Not on file

## 2021-08-10 ENCOUNTER — Ambulatory Visit
Admission: RE | Admit: 2021-08-10 | Discharge: 2021-08-10 | Disposition: A | Discharge status: Home / Self Care | Payer: BC Managed Care – PPO | Source: Ambulatory Visit | Attending: Internal Medicine | Admitting: Internal Medicine

## 2021-08-10 ENCOUNTER — Other Ambulatory Visit: Payer: Self-pay

## 2021-08-10 DIAGNOSIS — Z1231 Encounter for screening mammogram for malignant neoplasm of breast: Secondary | ICD-10-CM

## 2021-08-27 ENCOUNTER — Encounter: Payer: Self-pay | Admitting: Internal Medicine

## 2021-08-27 ENCOUNTER — Ambulatory Visit: Payer: BC Managed Care – PPO | Admitting: Internal Medicine

## 2021-08-27 ENCOUNTER — Other Ambulatory Visit: Payer: Self-pay

## 2021-08-27 VITALS — BP 120/78 | HR 103 | Temp 99.1°F | Ht 68.0 in | Wt 228.6 lb

## 2021-08-27 DIAGNOSIS — N951 Menopausal and female climacteric states: Secondary | ICD-10-CM | POA: Diagnosis not present

## 2021-08-27 DIAGNOSIS — I10 Essential (primary) hypertension: Secondary | ICD-10-CM | POA: Diagnosis not present

## 2021-08-27 DIAGNOSIS — Z6834 Body mass index (BMI) 34.0-34.9, adult: Secondary | ICD-10-CM

## 2021-08-27 DIAGNOSIS — R000 Tachycardia, unspecified: Secondary | ICD-10-CM | POA: Insufficient documentation

## 2021-08-27 DIAGNOSIS — E66811 Obesity, class 1: Secondary | ICD-10-CM | POA: Insufficient documentation

## 2021-08-27 DIAGNOSIS — E6609 Other obesity due to excess calories: Secondary | ICD-10-CM

## 2021-08-27 DIAGNOSIS — Z2821 Immunization not carried out because of patient refusal: Secondary | ICD-10-CM

## 2021-08-27 MED ORDER — LO LOESTRIN FE 1 MG-10 MCG / 10 MCG PO TABS: 1.0000 | ORAL_TABLET | Freq: Every day | ORAL | 1 refills | Status: DC

## 2021-08-27 MED ORDER — TELMISARTAN-HCTZ 80-25 MG PO TABS: 1.0000 | ORAL_TABLET | Freq: Every day | ORAL | 2 refills | Status: DC

## 2021-08-27 MED ORDER — SIMVASTATIN 20 MG PO TABS: ORAL_TABLET | ORAL | 2 refills | Status: DC

## 2021-08-27 NOTE — Patient Instructions (Signed)
Cooking With Less Salt Cooking with less salt is one way to reduce the amount of sodium you get from food. Sodium is one of the elements that make up salt. It is found naturally in foods and is also added to certain foods. Depending on your condition and overall health, your health care provider or dietitian may recommend that you reduce your sodium intake. Most people should have less than 2,300 milligrams (mg) of sodium each day. If you have high blood pressure (hypertension), you may need to limit your sodium to 1,500 mg each day. Follow the tipsbelow to help reduce your sodium intake. What are tips for eating less sodium? Reading food labels  Check the food label before buying or using packaged ingredients. Always check the label for the serving size and sodium content. Look for products with no more than 140 mg of sodium in one serving. Check the % Daily Value column to see what percent of the daily recommended amount of sodium is provided in one serving of the product. Foods with 5% or less in this column are considered low in sodium. Foods with 20% or higher are considered high in sodium. Do not choose foods with salt as one of the first three ingredients on the ingredients list. If salt is one of the first three ingredients, it usually means the item is high in sodium.  Shopping Buy sodium-free or low-sodium products. Look for the following words on food labels: Low-sodium. Sodium-free. Reduced-sodium. No salt added. Unsalted. Always check the sodium content even if foods are labeled as low-sodium or no salt added. Buy fresh foods. Cooking Use herbs, seasonings without salt, and spices as substitutes for salt. Use sodium-free baking soda when baking. Grill, braise, or roast foods to add flavor with less salt. Avoid adding salt to pasta, rice, or hot cereals. Drain and rinse canned vegetables, beans, and meat before use. Avoid adding salt when cooking sweets and desserts. Cook with  low-sodium ingredients. What foods are high in sodium? Vegetables Regular canned vegetables (not low-sodium or reduced-sodium). Sauerkraut, pickled vegetables, and relishes. Olives. French fries. Onion rings. Regular canned tomato sauce and paste. Regular tomato and vegetable juice. Frozenvegetables in sauces. Grains Instant hot cereals. Bread stuffing, pancake, and biscuit mixes. Croutons. Seasoned rice or pasta mixes. Noodle soup cups. Boxed or frozen macaroni and cheese. Regular salted crackers. Self-rising flour. Rolls. Bagels. Flourtortillas and wraps. Meats and other proteins Meat or fish that is salted, canned, smoked, cured, spiced, or pickled. This includes bacon, ham, sausages, hot dogs, corned beef, chipped beef, meat loaves, salt pork, jerky, pickled herring, anchovies, regular canned tuna, andsardines. Salted nuts. Dairy Processed cheese and cheese spreads. Cheese curds. Blue cheese. Feta cheese.String cheese. Regular cottage cheese. Buttermilk. Canned milk. The items listed above may not be a complete list of foods high in sodium. Actual amounts of sodium may be different depending on processing. Contact a dietitian for more information. What foods are low in sodium? Fruits Fresh, frozen, or canned fruit with no sauce added. Fruit juice. Vegetables Fresh or frozen vegetables with no sauce added. "No salt added" canned vegetables. "No salt added" tomato sauce and paste. Low-sodium orreduced-sodium tomato and vegetable juice. Grains Noodles, pasta, quinoa, rice. Shredded or puffed wheat or puffed rice. Regular or quick oats (not instant). Low-sodium crackers. Low-sodium bread. Whole-grainbread and whole-grain pasta. Unsalted popcorn. Meats and other proteins Fresh or frozen whole meats, poultry (not injected with sodium), and fish with no sauce added. Unsalted nuts. Dried peas, beans, and   lentils without added salt. Unsalted canned beans. Eggs. Unsalted nut butters. Low-sodium canned  tunaor chicken. Dairy Milk. Soy milk. Yogurt. Low-sodium cheeses, such as Swiss, Monterey Jack, mozzarella, and ricotta. Sherbet or ice cream (keep to  cup per serving).Cream cheese. Fats and oils Unsalted butter or margarine. Other foods Homemade pudding. Sodium-free baking soda and baking powder. Herbs and spices.Low-sodium seasoning mixes. Beverages Coffee and tea. Carbonated beverages. The items listed above may not be a complete list of foods low in sodium. Actual amounts of sodium may be different depending on processing. Contact a dietitian for more information. What are some salt alternatives when cooking? The following are herbs, seasonings, and spices that can be used instead of salt to flavor your food. Herbs should be fresh or dried. Do not choose packaged mixes. Next to the name of the herb, spice, or seasoning aresome examples of foods you can pair it with. Herbs Bay leaves - Soups, meat and vegetable dishes, and spaghetti sauce. Basil - Italian dishes, soups, pasta, and fish dishes. Cilantro - Meat, poultry, and vegetable dishes. Chili powder - Marinades and Mexican dishes. Chives - Salad dressings and potato dishes. Cumin - Mexican dishes, couscous, and meat dishes. Dill - Fish dishes, sauces, and salads. Fennel - Meat and vegetable dishes, breads, and cookies. Garlic (do not use garlic salt) - Italian dishes, meat dishes, salad dressings, and sauces. Marjoram - Soups, potato dishes, and meat dishes. Oregano - Pizza and spaghetti sauce. Parsley - Salads, soups, pasta, and meat dishes. Rosemary - Italian dishes, salad dressings, soups, and red meats. Saffron - Fish dishes, pasta, and some poultry dishes. Sage - Stuffings and sauces. Tarragon - Fish and poultry dishes. Thyme - Stuffing, meat, and fish dishes. Seasonings Lemon juice - Fish dishes, poultry dishes, vegetables, and salads. Vinegar - Salad dressings, vegetables, and fish dishes. Spices Cinnamon - Sweet  dishes, such as cakes, cookies, and puddings. Cloves - Gingerbread, puddings, and marinades for meats. Curry - Vegetable dishes, fish and poultry dishes, and stir-fry dishes. Ginger - Vegetable dishes, fish dishes, and stir-fry dishes. Nutmeg - Pasta, vegetables, poultry, fish dishes, and custard. Summary Cooking with less salt is one way to reduce the amount of sodium that you get from food. Buy sodium-free or low-sodium products. Check the food label before using or buying packaged ingredients. Use herbs, seasonings without salt, and spices as substitutes for salt in foods. This information is not intended to replace advice given to you by your health care provider. Make sure you discuss any questions you have with your healthcare provider. Document Revised: 09/19/2019 Document Reviewed: 09/19/2019 Elsevier Patient Education  2022 Elsevier Inc.  

## 2021-08-27 NOTE — Progress Notes (Signed)
I,Katawbba Wiggins,acting as a Neurosurgeon for Gwynneth Aliment, MD.,have documented all relevant documentation on the behalf of Gwynneth Aliment, MD,as directed by  Gwynneth Aliment, MD while in the presence of Gwynneth Aliment, MD.  This visit occurred during the SARS-CoV-2 public health emergency.  Safety protocols were in place, including screening questions prior to the visit, additional usage of staff PPE, and extensive cleaning of exam room while observing appropriate contact time as indicated for disinfecting solutions.  Subjective:     Patient ID: Sara Short , female    DOB: 1961-04-24 , 60 y.o.   MRN: [id number]   Chief Complaint  Patient presents with   Hypertension    HPI  She presents today for BP check.  She reports compliance with meds. She denies having any headaches, chest pain and shortness of breath. States she has taken on more shifts at work, this has resulted in her doing more walking.   Hypertension This is a chronic problem. The current episode started more than 1 year ago. The problem has been gradually improving since onset. The problem is uncontrolled. Pertinent negatives include no blurred vision, chest pain, palpitations or shortness of breath. Risk factors for coronary artery disease include obesity, post-menopausal state and sedentary lifestyle. The current treatment provides moderate improvement. Compliance problems include exercise.     Past Medical History:  Diagnosis Date   Allergic rhinitis    HTN (hypertension)    Obesity      Family History  Problem Relation Age of Onset   Diverticulitis Mother    Hypertension Mother    Diabetes Father      Current Outpatient Medications:    Chlorpheniramine Maleate (ALLERGY PO), Take by mouth., Disp: , Rfl:    Cholecalciferol (VITAMIN D3) 25 MCG (1000 UT) CAPS, Take 2 capsules (2,000 Units total) by mouth daily. 1 per day, Disp: 60 capsule, Rfl: 1   cyclobenzaprine (FLEXERIL) 5 MG tablet, One tab twice  daily as needed, do not drive while on this medication, Disp: 30 tablet, Rfl: 0   Magnesium 400 MG CAPS, Take 400 mg by mouth Nightly., Disp: 90 capsule, Rfl: 1   Multiple Vitamin (MULTIVITAMIN) capsule, Take 1 capsule by mouth daily., Disp: , Rfl:    nystatin (NYSTATIN) powder, Apply 1 application topically 3 (three) times daily., Disp: 60 g, Rfl: 0   nystatin cream (MYCOSTATIN), APPLY TOPICALLY TO THE AFFECTED AREA TWICE DAILY, Disp: 30 g, Rfl: 0   predniSONE (DELTASONE) 10 MG tablet, Take 2 tablets (20 mg total) by mouth daily with breakfast., Disp: 60 tablet, Rfl: 1   Norethindrone-Ethinyl Estradiol-Fe Biphas (LO LOESTRIN FE) 1 MG-10 MCG / 10 MCG tablet, Take 1 tablet by mouth daily., Disp: 28 tablet, Rfl: 1   simvastatin (ZOCOR) 20 MG tablet, TAKE 1 TABLET(20 MG) BY MOUTH AT BEDTIME, Disp: 90 tablet, Rfl: 2   telmisartan-hydrochlorothiazide (MICARDIS HCT) 80-25 MG tablet, Take 1 tablet by mouth daily., Disp: 90 tablet, Rfl: 2   Allergies  Allergen Reactions   Crestor [Rosuvastatin Calcium] Hives   Lipitor [Atorvastatin Calcium] Hives     Review of Systems  Constitutional: Negative.   Eyes:  Negative for blurred vision.  Respiratory: Negative.  Negative for shortness of breath.   Cardiovascular: Negative.  Negative for chest pain and palpitations.  Gastrointestinal: Negative.   Genitourinary:        Pt was referred to Gyn for further evaluation of postmenopausal symptoms. However, she refused appt. She didn't think "she needed it"  Psychiatric/Behavioral: Negative.    All other systems reviewed and are negative.   Today's Vitals   08/27/21 1019  BP: 120/78  Pulse: (!) 103  Temp: 99.1 F (37.3 C)  Weight: 228 lb 9.6 oz (103.7 kg)  Height: 5\' 8"  (1.727 m)   Body mass index is 34.76 kg/m.  Wt Readings from Last 3 Encounters:  08/27/21 228 lb 9.6 oz (103.7 kg)  06/01/21 235 lb 12.8 oz (107 kg)  01/29/21 242 lb (109.8 kg)    BP Readings from Last 3 Encounters:  08/27/21  120/78  06/01/21 124/70  01/29/21 120/82     Objective:  Physical Exam Vitals and nursing note reviewed.  Constitutional:      Appearance: Normal appearance.  HENT:     Head: Normocephalic and atraumatic.     Nose:     Comments: Masked     Mouth/Throat:     Comments: Masked  Eyes:     Extraocular Movements: Extraocular movements intact.  Cardiovascular:     Rate and Rhythm: Normal rate and regular rhythm.     Heart sounds: Normal heart sounds.  Pulmonary:     Effort: Pulmonary effort is normal.     Breath sounds: Normal breath sounds.  Musculoskeletal:     Cervical back: Normal range of motion.  Skin:    General: Skin is warm.  Neurological:     General: No focal deficit present.     Mental Status: She is alert.  Psychiatric:        Mood and Affect: Mood normal.        Behavior: Behavior normal.        Assessment And Plan:     1. Essential hypertension, benign Comments: Chronic, well controlled. NO med changes today. Encouraged to follow low sodium diet.   2. Perimenopause Comments: She is adamant that she does not want to stop oral contraception. Pt advised of risks associated with estrogen-containing OCPs at her age, including heart disease and breast cancer. She was prescribed Micronor in the past; however, she states she did not tolerate the medication. Therefore, she will continue with Lo-loestrin for now. We agree to stop/change meds at her next physical Feb 2023. She is reminded that peri/post-menopausal symptoms can be managed with clean eating, including foods that are not processed, packaged, sugary, etc.   3. Tachycardia Comments: She admits to not having much water yet today. Encouraged to stay well hydrated. Advised to limit intake of caffeinated beverages.   4. Class 1 obesity due to excess calories with serious comorbidity and body mass index (BMI) of 34.0 to 34.9 in adult Comments: She is encouraged to strive for BMI less than 30 to decrease cardiac  risk. Advised to aim for at least 150 minutes of exercise per week.  5. Influenza vaccination declined    Patient was given opportunity to ask questions. Patient verbalized understanding of the plan and was able to repeat key elements of the plan. All questions were answered to their satisfaction.   I, Mar 2023, MD, have reviewed all documentation for this visit. The documentation on 08/29/21 for the exam, diagnosis, procedures, and orders are all accurate and complete.   IF YOU HAVE BEEN REFERRED TO A SPECIALIST, IT MAY TAKE 1-2 WEEKS TO SCHEDULE/PROCESS THE REFERRAL. IF YOU HAVE NOT HEARD FROM US/SPECIALIST IN TWO WEEKS, PLEASE GIVE 08/31/21 A CALL AT [phone] X [phone].   THE PATIENT IS ENCOURAGED TO PRACTICE SOCIAL DISTANCING DUE TO THE COVID-19 PANDEMIC.

## 2021-09-02 ENCOUNTER — Other Ambulatory Visit: Payer: Self-pay | Admitting: Physician Assistant

## 2021-09-05 ENCOUNTER — Other Ambulatory Visit: Payer: Self-pay | Admitting: Internal Medicine

## 2021-10-03 IMAGING — US US RENAL
1 series · 14 of 25 positions shown · non-contrast
Comparison: None.

CLINICAL DATA: Microscopic hematuria.

EXAM:
RENAL / URINARY TRACT ULTRASOUND COMPLETE

[Series 1: us renal · 0.19mm/px · 14 of 34 slices shown]
[im 1/34]
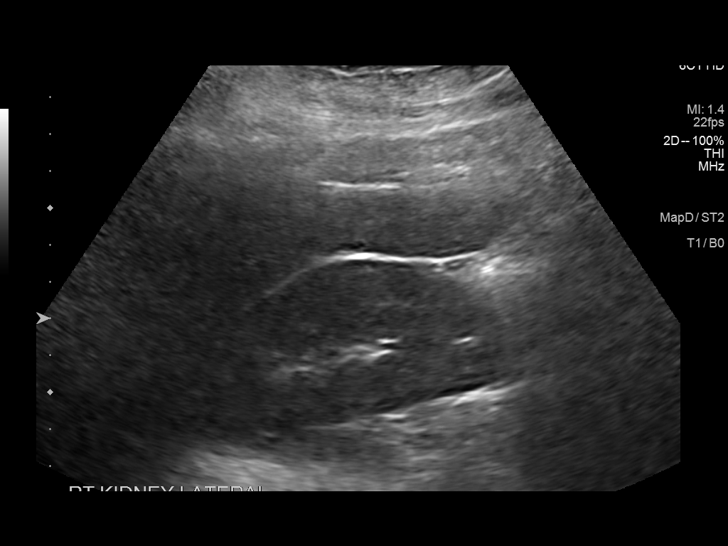
[im 3/34]
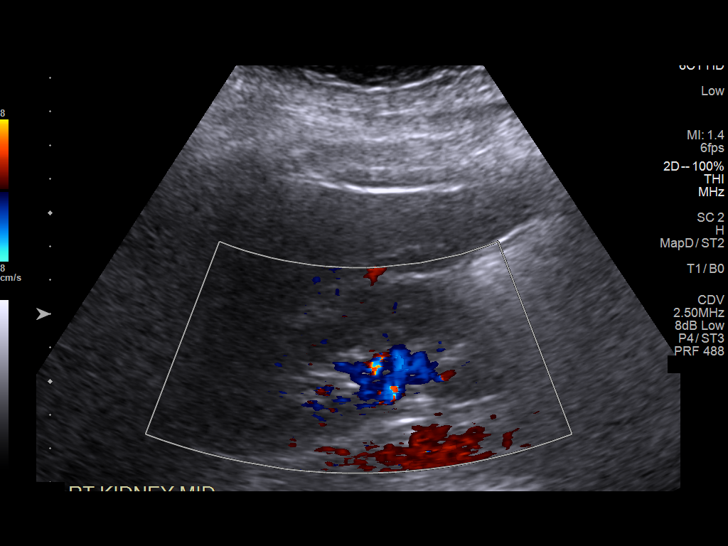
[im 6/34]
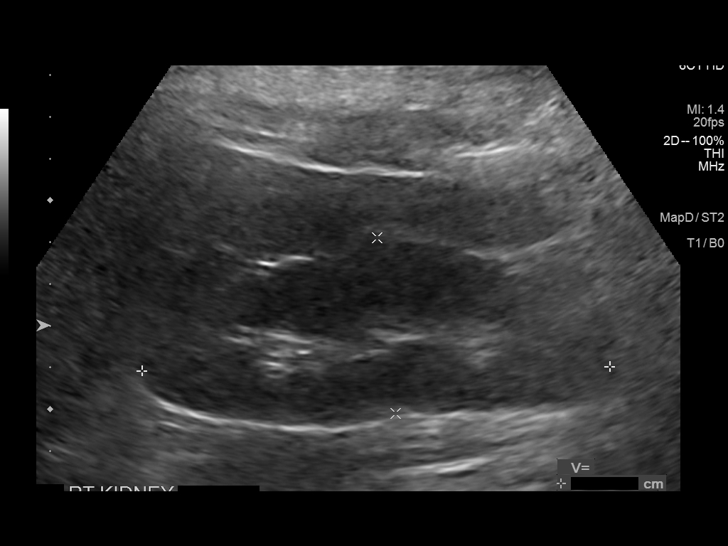
[im 9/34]
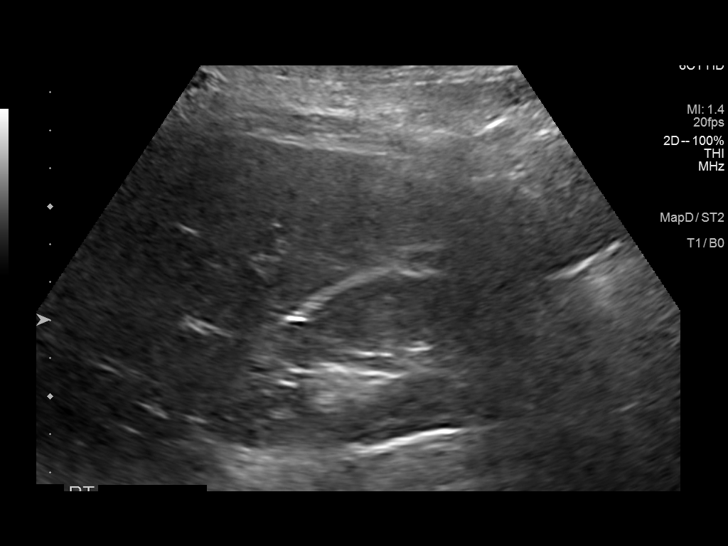
[im 12/34]
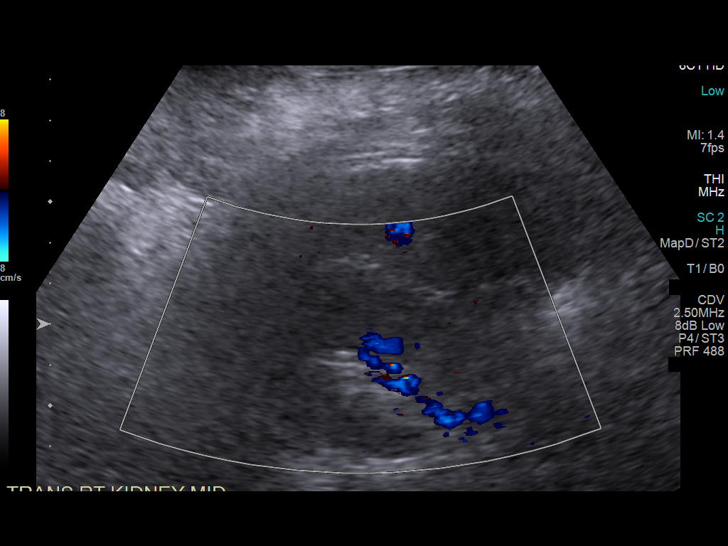
[im 13/34]
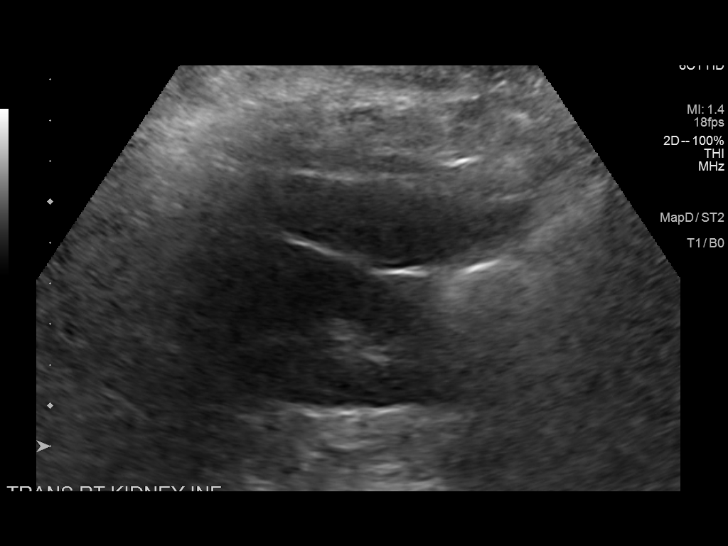
[im 16/34]
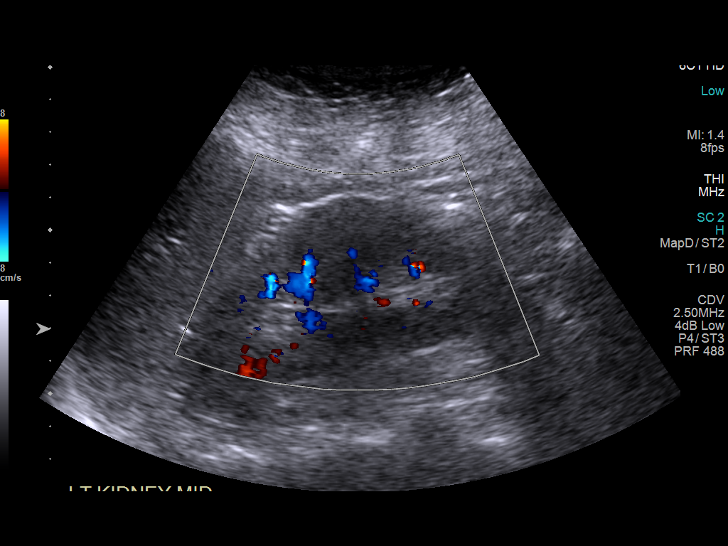
[im 18/34]
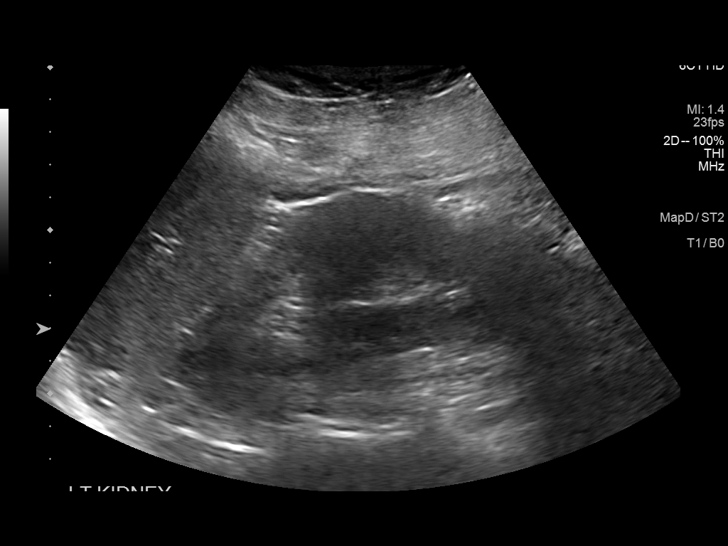
[im 21/34]
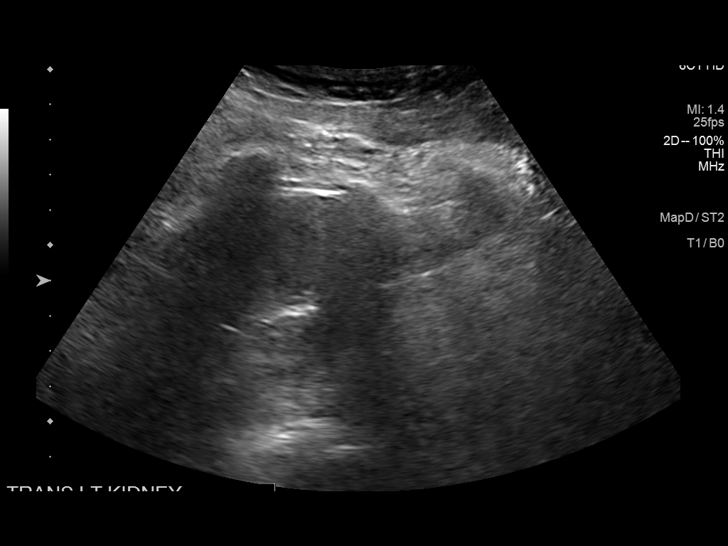
[im 23/34]
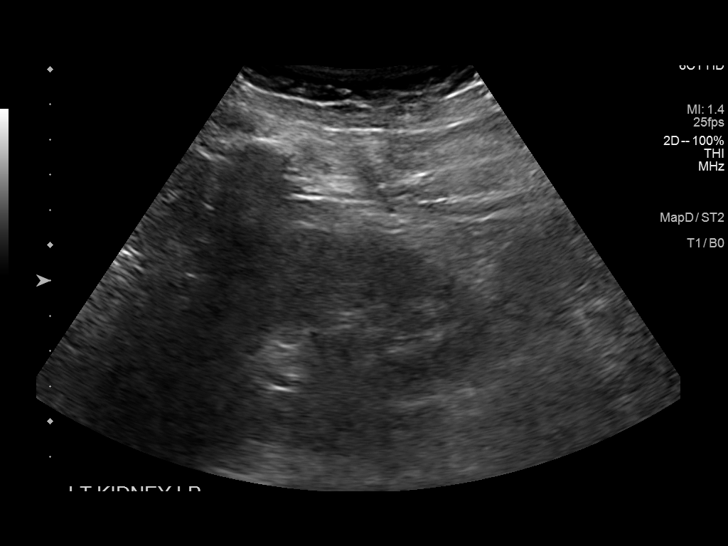
[im 25/34]
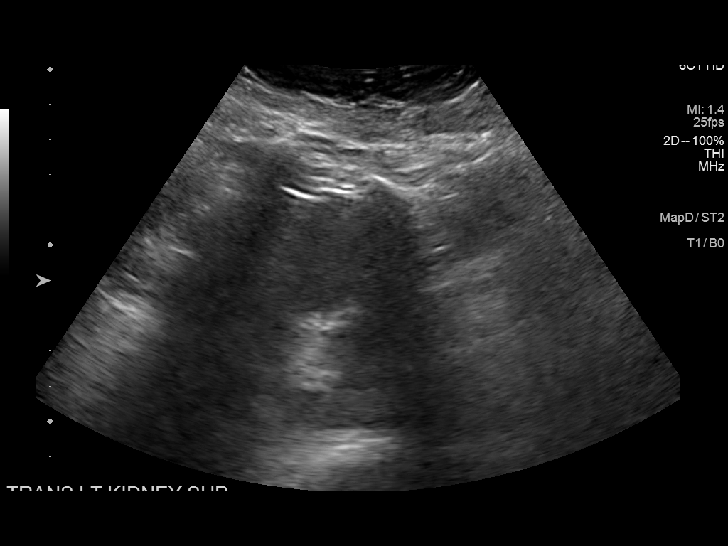
[im 28/34]
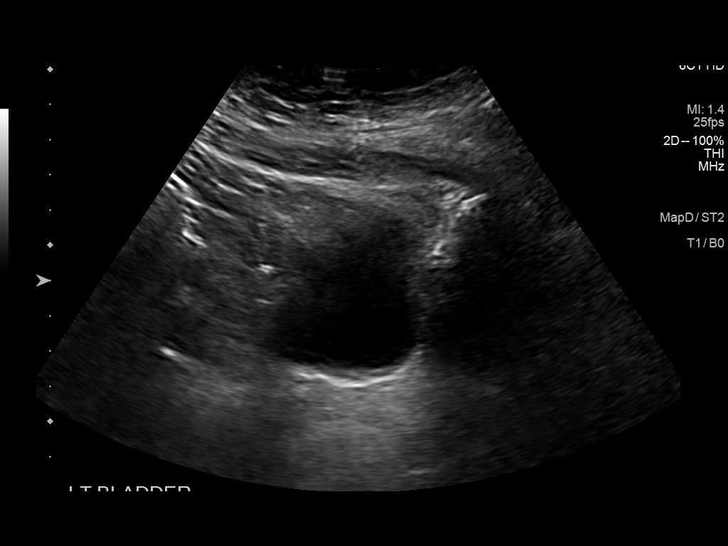
[im 31/34]
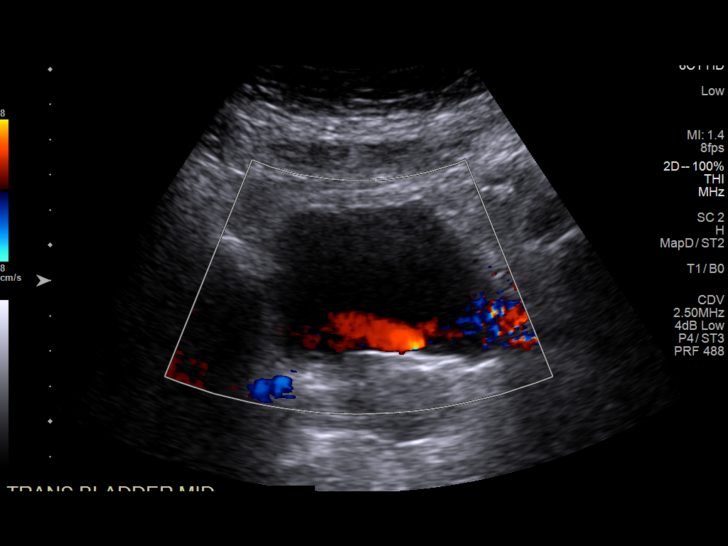
[im 34/34]
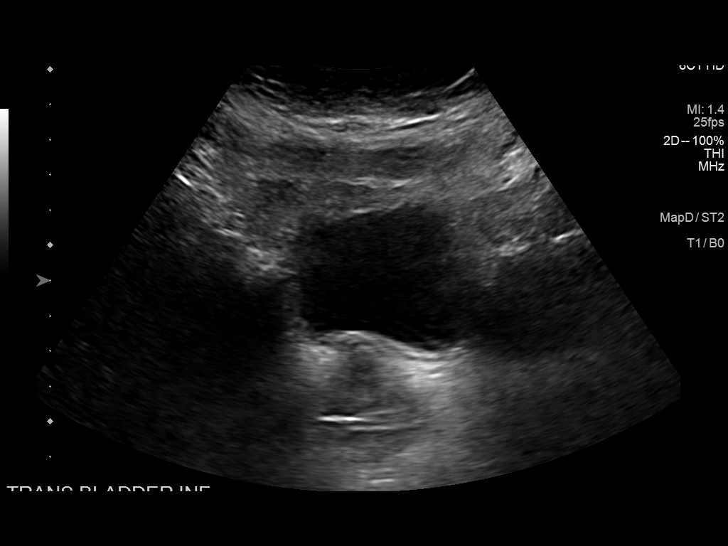

[14 of 25 positions shown; findings below may reference images not displayed]

FINDINGS: Right Kidney:

Renal measurements: 11.2 x 4.2 x 5.6 cm = volume: 131 mL. Normal
parenchymal echogenicity. No hydronephrosis. No visualized stone or
focal lesion.

Left Kidney:

Renal measurements: 11.0 x 5.1 x 4.7 cm = volume: 137 mL. Normal
parenchymal echogenicity. No hydronephrosis. No visualized stone or
focal lesion.

Bladder:

Appears normal for degree of bladder distention.

Other:

Technically limited exam due to overlying bowel gas and body
habitus.
IMPRESSION: Unremarkable sonographic appearance of the kidneys and bladder. No
explanation for hematuria.

## 2021-10-22 ENCOUNTER — Other Ambulatory Visit: Payer: Self-pay

## 2021-10-22 MED ORDER — LO LOESTRIN FE 1 MG-10 MCG / 10 MCG PO TABS: 1.0000 | ORAL_TABLET | Freq: Every day | ORAL | 1 refills | Status: DC

## 2021-11-01 ENCOUNTER — Other Ambulatory Visit: Payer: Self-pay | Admitting: Physician Assistant

## 2021-11-12 ENCOUNTER — Telehealth: Payer: Self-pay | Admitting: Orthopedic Surgery

## 2021-11-12 ENCOUNTER — Other Ambulatory Visit: Payer: Self-pay | Admitting: Orthopedic Surgery

## 2021-11-12 MED ORDER — PREDNISONE 10 MG PO TABS: 10.0000 mg | ORAL_TABLET | Freq: Every day | ORAL | 0 refills | Status: DC

## 2021-11-12 NOTE — Telephone Encounter (Signed)
Pt had been on prednisone 20mg  a day for chronic left knee pain and lower back pain with left sided sciatica. She is requesting refill.

## 2021-11-12 NOTE — Telephone Encounter (Signed)
Patient called needing Rx refilled Prednisone. The number to contact patient is [phone]

## 2021-11-13 NOTE — Telephone Encounter (Signed)
Pt informed

## 2021-12-08 ENCOUNTER — Other Ambulatory Visit (HOSPITAL_COMMUNITY)
Admission: RE | Admit: 2021-12-08 | Discharge: 2021-12-08 | Disposition: A | Discharge status: Home / Self Care | Payer: BC Managed Care – PPO | Source: Ambulatory Visit | Attending: Internal Medicine | Admitting: Internal Medicine

## 2021-12-08 ENCOUNTER — Ambulatory Visit (INDEPENDENT_AMBULATORY_CARE_PROVIDER_SITE_OTHER): Payer: BC Managed Care – PPO | Admitting: Internal Medicine

## 2021-12-08 ENCOUNTER — Other Ambulatory Visit: Payer: Self-pay

## 2021-12-08 ENCOUNTER — Encounter: Payer: Self-pay | Admitting: Internal Medicine

## 2021-12-08 VITALS — BP 122/80 | HR 90 | Temp 99.2°F | Ht 67.8 in | Wt 226.4 lb

## 2021-12-08 DIAGNOSIS — Z6834 Body mass index (BMI) 34.0-34.9, adult: Secondary | ICD-10-CM

## 2021-12-08 DIAGNOSIS — E6609 Other obesity due to excess calories: Secondary | ICD-10-CM | POA: Diagnosis not present

## 2021-12-08 DIAGNOSIS — Z2821 Immunization not carried out because of patient refusal: Secondary | ICD-10-CM

## 2021-12-08 DIAGNOSIS — E559 Vitamin D deficiency, unspecified: Secondary | ICD-10-CM

## 2021-12-08 DIAGNOSIS — I10 Essential (primary) hypertension: Secondary | ICD-10-CM | POA: Diagnosis not present

## 2021-12-08 DIAGNOSIS — Z01419 Encounter for gynecological examination (general) (routine) without abnormal findings: Secondary | ICD-10-CM | POA: Diagnosis present

## 2021-12-08 DIAGNOSIS — Z0000 Encounter for general adult medical examination without abnormal findings: Secondary | ICD-10-CM

## 2021-12-08 DIAGNOSIS — Z124 Encounter for screening for malignant neoplasm of cervix: Secondary | ICD-10-CM | POA: Diagnosis not present

## 2021-12-08 LAB — POCT URINALYSIS DIPSTICK
Bilirubin, UA: NEGATIVE
Glucose, UA: NEGATIVE
Ketones, UA: NEGATIVE
Leukocytes, UA: NEGATIVE
Nitrite, UA: NEGATIVE
Protein, UA: NEGATIVE
Spec Grav, UA: 1.02 (ref 1.010–1.025)
Urobilinogen, UA: 0.2 E.U./dL
pH, UA: 7 (ref 5.0–8.0)

## 2021-12-08 IMAGING — MG MM DIGITAL SCREENING BILAT W/ TOMO AND CAD
8 series · 8 of 24 positions shown · non-contrast
Comparison: Previous exam(s).

CLINICAL DATA: Screening.

EXAM:
DIGITAL SCREENING BILATERAL MAMMOGRAM WITH TOMOSYNTHESIS AND CAD
TECHNIQUE: Bilateral screening digital craniocaudal and mediolateral oblique
mammograms were obtained. Bilateral screening digital breast
tomosynthesis was performed. The images were evaluated with
computer-aided detection.

[L CC synth-2D]
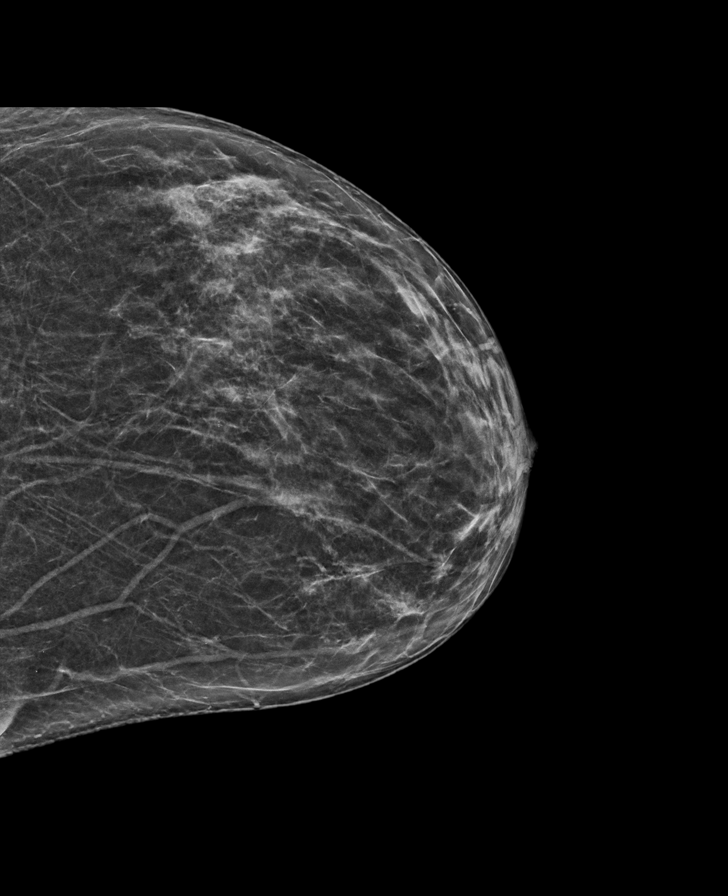

[L MLO synth-2D]
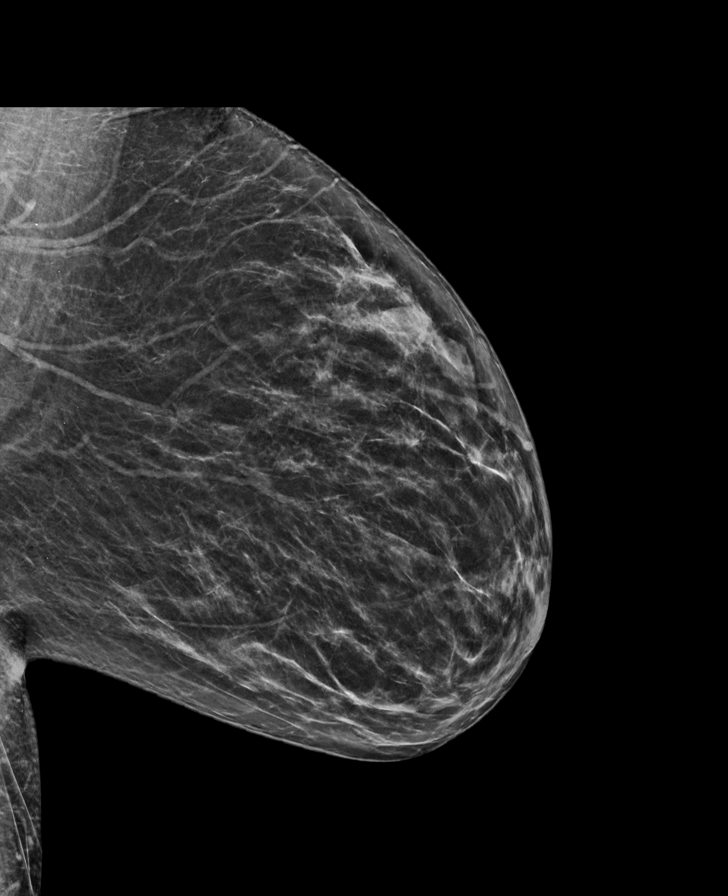

[R CC synth-2D]
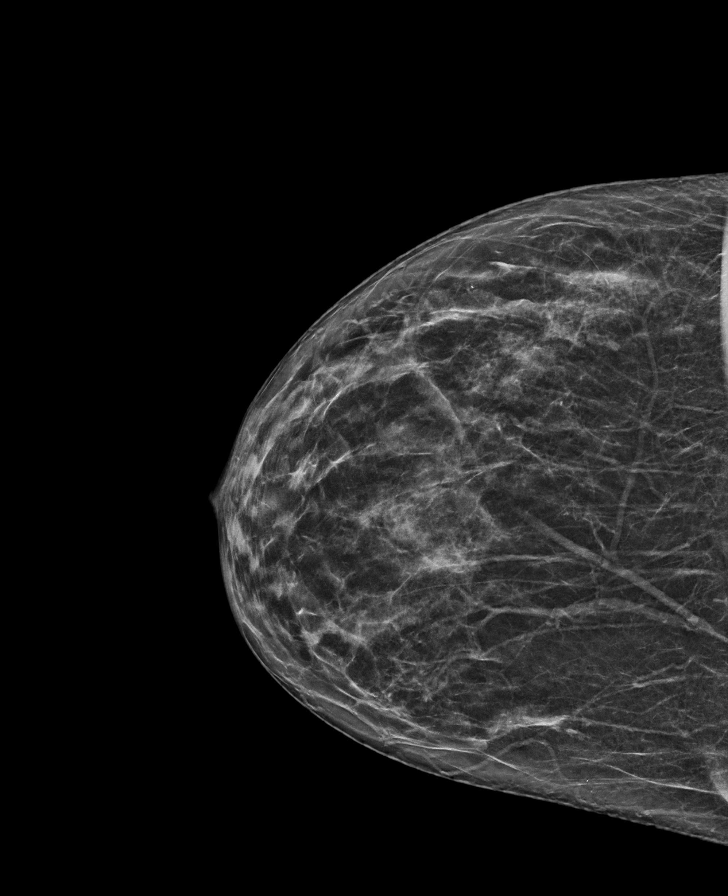

[R MLO synth-2D]
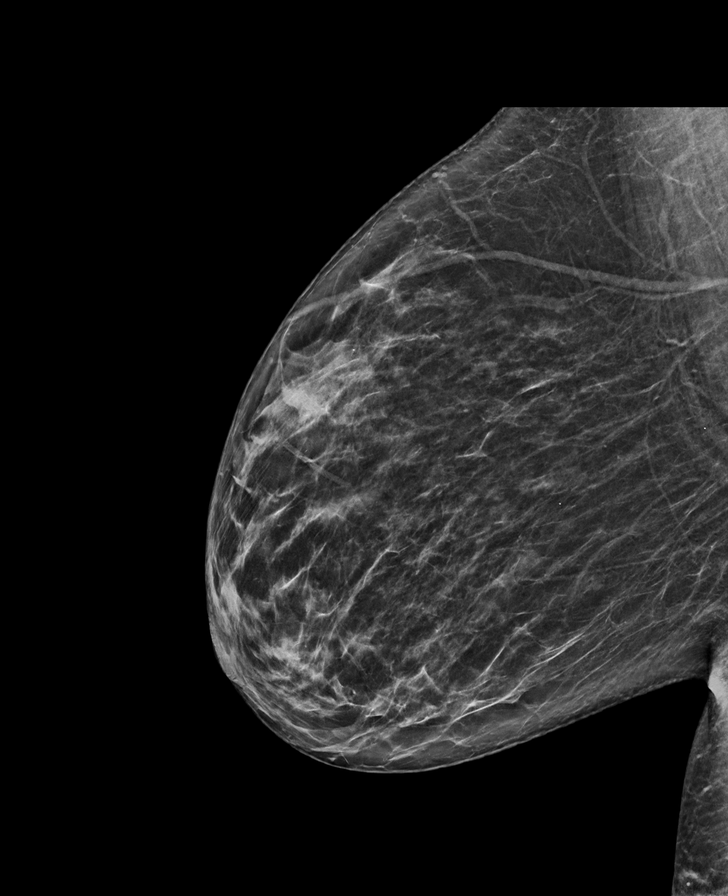

[R CC tomo · tomo slice 33/64.0]
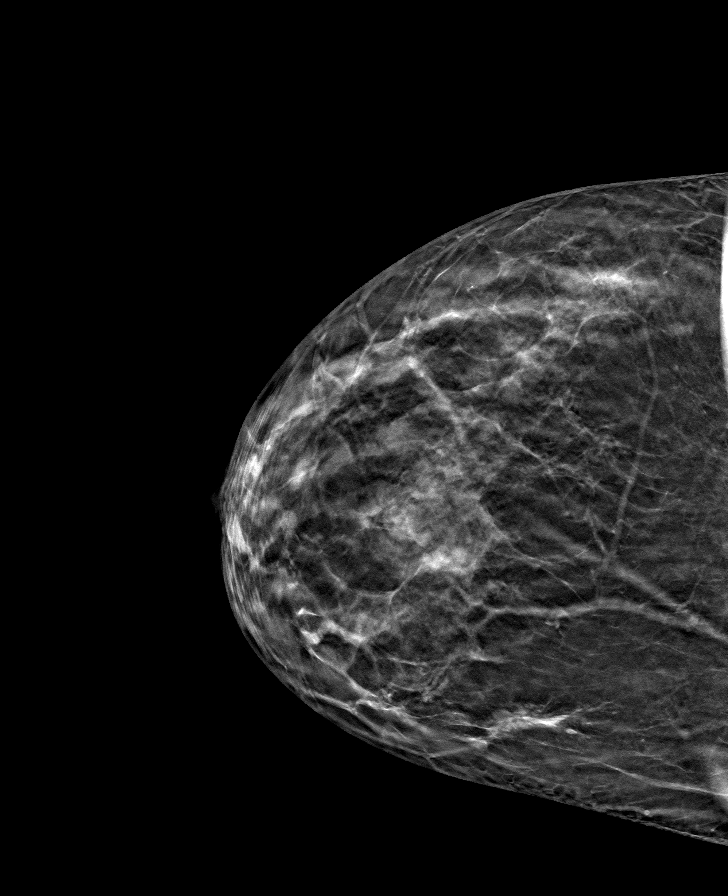

[R MLO tomo · tomo slice 36/71.0]
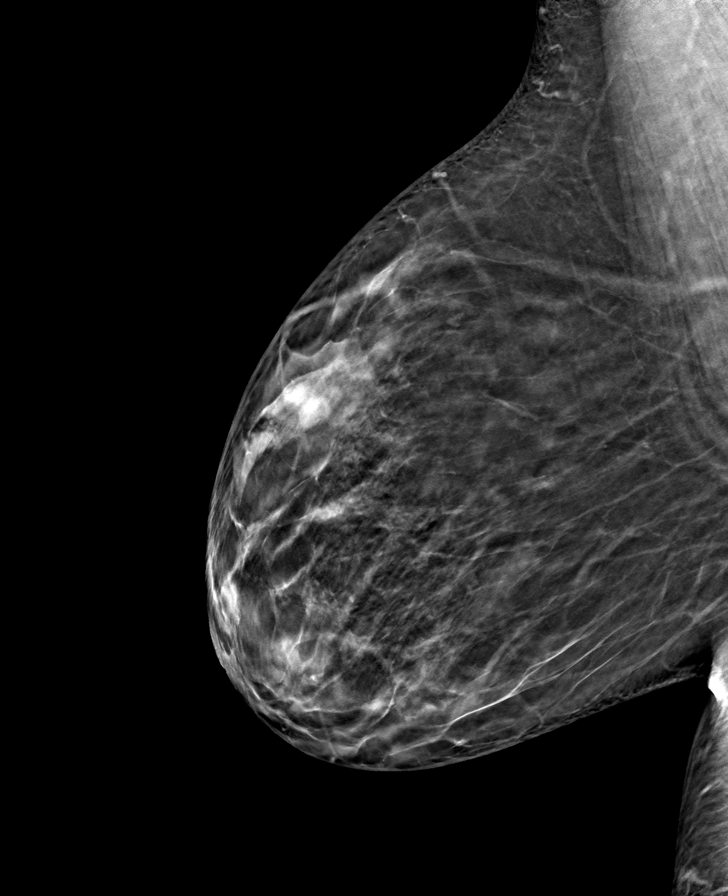

[L MLO tomo · tomo slice 35/70.0]
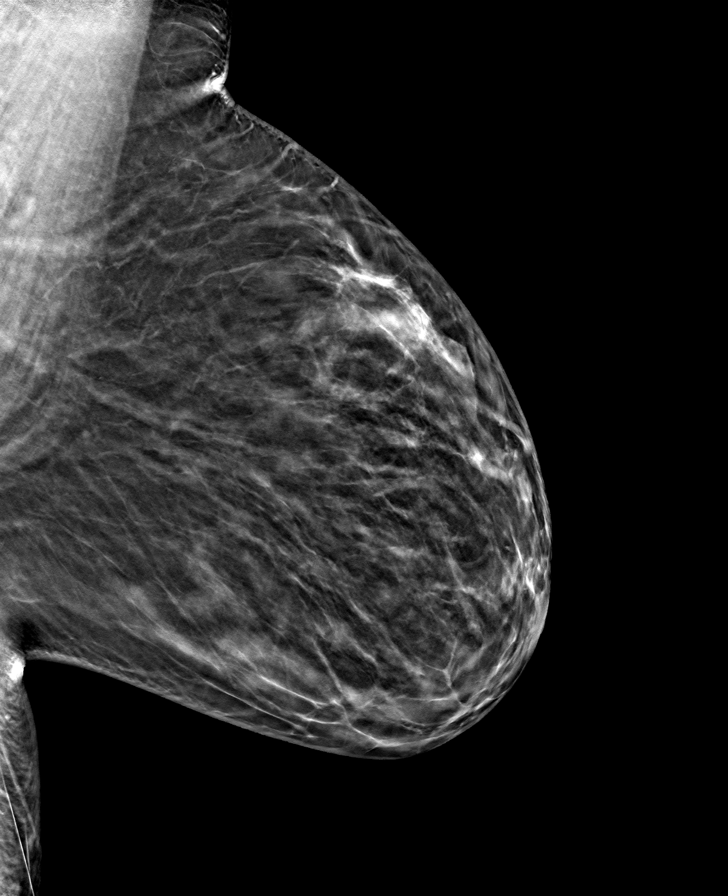

[L CC tomo · tomo slice 33/66.0]
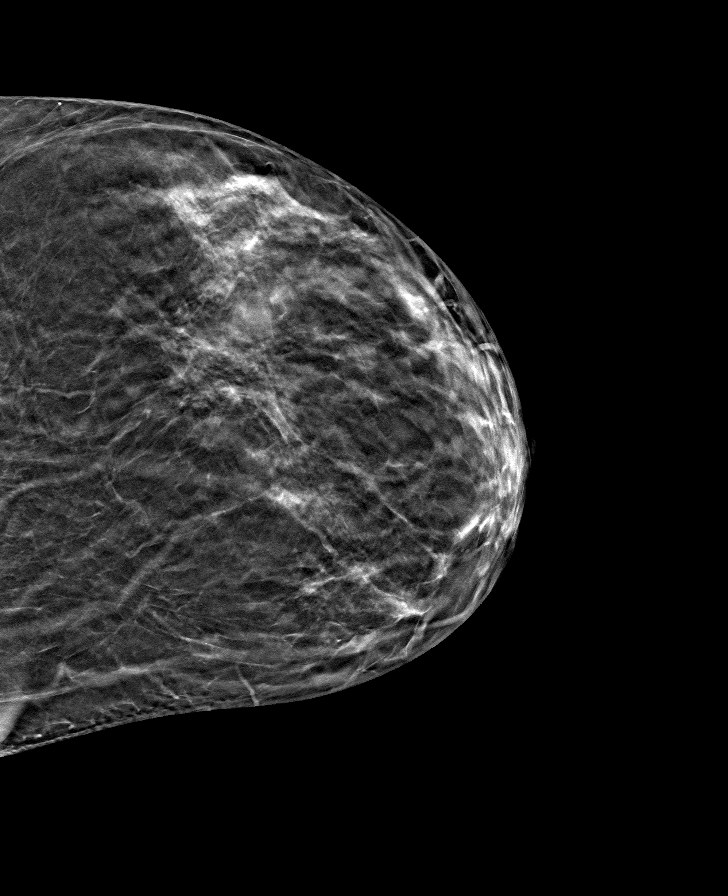

[8 of 24 positions shown; findings below may reference images not displayed]

ACR Breast Density Category b: There are scattered areas of
fibroglandular density.
FINDINGS: There are no findings suspicious for malignancy.
IMPRESSION: No mammographic evidence of malignancy. A result letter of this
screening mammogram will be mailed directly to the patient.

RECOMMENDATION:
Screening mammogram in one year. (Code:51-O-LD2)

BI-RADS CATEGORY  1: Negative.

## 2021-12-08 NOTE — Patient Instructions (Signed)

## 2021-12-08 NOTE — Progress Notes (Signed)
Rich Brave [place]ittleton,acting as a Education administrator for Maximino Greenland, MD.,have documented all relevant documentation on the behalf of Maximino Greenland, MD,as directed by  Maximino Greenland, MD while in the presence of Maximino Greenland, MD.  This visit occurred during the SARS-CoV-2 public health emergency.  Safety protocols were in place, including screening questions prior to the visit, additional usage of staff PPE, and extensive cleaning of exam room while observing appropriate contact time as indicated for disinfecting solutions.  Subjective:     Patient ID: Sara Short , female    DOB: Feb 08, 1961 , 61 y.o.   MRN: [id number]   Chief Complaint  Patient presents with   Annual Exam    HPI  She is here today for a full physical exam. She is not followed by GYN. She has been referred in the past; however, she declined referral. She reports compliance with meds. She denies headaches, chest pain and shortness of breath.   Hypertension This is a chronic problem. The current episode started more than 1 year ago. The problem has been gradually improving since onset. The problem is controlled. Pertinent negatives include no blurred vision, chest pain, palpitations or shortness of breath. The current treatment provides moderate improvement. Compliance problems include exercise.     Past Medical History:  Diagnosis Date   Allergic rhinitis    HTN (hypertension)    Obesity      Family History  Problem Relation Age of Onset   Diverticulitis Mother    Hypertension Mother    Diabetes Father      Current Outpatient Medications:    Chlorpheniramine Maleate (ALLERGY PO), Take by mouth., Disp: , Rfl:    Cholecalciferol (VITAMIN D3) 25 MCG (1000 UT) CAPS, Take 2 capsules (2,000 Units total) by mouth daily. 1 per day, Disp: 60 capsule, Rfl: 1   Magnesium 400 MG CAPS, Take 400 mg by mouth Nightly., Disp: 90 capsule, Rfl: 1   Multiple Vitamin (MULTIVITAMIN) capsule, Take 1 capsule by mouth daily.,  Disp: , Rfl:    Norethindrone-Ethinyl Estradiol-Fe Biphas (LO LOESTRIN FE) 1 MG-10 MCG / 10 MCG tablet, Take 1 tablet by mouth daily., Disp: 28 tablet, Rfl: 1   nystatin cream (MYCOSTATIN), APPLY TOPICALLY TO THE AFFECTED AREA TWICE DAILY, Disp: 30 g, Rfl: 0   simvastatin (ZOCOR) 20 MG tablet, TAKE 1 TABLET(20 MG) BY MOUTH AT BEDTIME, Disp: 90 tablet, Rfl: 2   telmisartan-hydrochlorothiazide (MICARDIS HCT) 80-25 MG tablet, Take 1 tablet by mouth daily., Disp: 90 tablet, Rfl: 2   cyclobenzaprine (FLEXERIL) 5 MG tablet, One tab twice daily as needed, do not drive while on this medication (Patient not taking: Reported on 12/08/2021), Disp: 30 tablet, Rfl: 0   nystatin (NYSTATIN) powder, Apply 1 application topically 3 (three) times daily. (Patient not taking: Reported on 12/08/2021), Disp: 60 g, Rfl: 0   predniSONE (DELTASONE) 10 MG tablet, Take 1 tablet (10 mg total) by mouth daily with breakfast. (Patient not taking: Reported on 12/08/2021), Disp: 30 tablet, Rfl: 0   Allergies  Allergen Reactions   Crestor [Rosuvastatin Calcium] Hives   Lipitor [Atorvastatin Calcium] Hives      The patient states she uses OCP (estrogen/progesterone) for birth control. Last LMP was Patient's last menstrual period was 12/24/2020.. Negative for Menorrhagia. Negative for: breast discharge, breast lump(s), breast pain and breast self exam. Associated symptoms include abnormal vaginal bleeding. Pertinent negatives include abnormal bleeding (hematology), anxiety, decreased libido, depression, difficulty falling sleep, dyspareunia, history of infertility, nocturia, sexual dysfunction,  sleep disturbances, urinary incontinence, urinary urgency, vaginal discharge and vaginal itching. Diet regular.The patient states her exercise level is  intermittent.   . The patient's tobacco use is:  Social History   Tobacco Use  Smoking Status Former   Packs/day: 0.50   Years: 20.00   Pack years: 10.00   Types: Cigarettes   Quit date:  03/12/2019   Years since quitting: 2.7  Smokeless Tobacco Never  Tobacco Comments   She quit in June  . She has been exposed to passive smoke. The patient's alcohol use is:  Social History   Substance and Sexual Activity  Alcohol Use Never   Review of Systems  Constitutional: Negative.   HENT: Negative.    Eyes: Negative.  Negative for blurred vision.  Respiratory: Negative.  Negative for shortness of breath.   Cardiovascular: Negative.  Negative for chest pain and palpitations.  Gastrointestinal: Negative.   Endocrine: Negative.   Genitourinary: Negative.   Musculoskeletal: Negative.   Skin: Negative.   Allergic/Immunologic: Negative.   Neurological: Negative.   Hematological: Negative.   Psychiatric/Behavioral: Negative.      Today's Vitals   12/08/21 1111  BP: 122/80  Pulse: 90  Temp: 99.2 F (37.3 C)  Weight: 226 lb 6.4 oz (102.7 kg)  Height: 5' 7.8" (1.722 m)   Body mass index is 34.63 kg/m.  Wt Readings from Last 3 Encounters:  12/08/21 226 lb 6.4 oz (102.7 kg)  08/27/21 228 lb 9.6 oz (103.7 kg)  06/01/21 235 lb 12.8 oz (107 kg)     Objective:  Physical Exam Vitals and nursing note reviewed. Exam conducted with a chaperone present.  Constitutional:      Appearance: Normal appearance.  HENT:     Head: Normocephalic and atraumatic.     Right Ear: Tympanic membrane, ear canal and external ear normal.     Left Ear: Tympanic membrane, ear canal and external ear normal.     Nose:     Comments: Masked     Mouth/Throat:     Comments: Masked  Eyes:     Extraocular Movements: Extraocular movements intact.     Conjunctiva/sclera: Conjunctivae normal.     Pupils: Pupils are equal, round, and reactive to light.  Cardiovascular:     Rate and Rhythm: Normal rate and regular rhythm.     Pulses: Normal pulses.     Heart sounds: Normal heart sounds.  Pulmonary:     Effort: Pulmonary effort is normal.     Breath sounds: Normal breath sounds.  Chest:  Breasts:     Tanner Score is 5.     Right: Normal.     Left: Normal.  Abdominal:     General: Abdomen is flat. Bowel sounds are normal.     Palpations: Abdomen is soft.     Hernia: A hernia is present. There is no hernia in the left inguinal area or right inguinal area.  Genitourinary:    General: Normal vulva.     Exam position: Lithotomy position.     Vagina: Normal.     Cervix: Normal.     Uterus: Normal.      Adnexa:        Right: No mass.         Left: No mass.       Rectum: Normal. Guaiac result negative.  Musculoskeletal:        General: Normal range of motion.     Cervical back: Normal range of motion and neck supple.  Lymphadenopathy:     Lower Body: No right inguinal adenopathy. No left inguinal adenopathy.  Skin:    General: Skin is warm and dry.  Neurological:     General: No focal deficit present.     Mental Status: She is alert and oriented to person, place, and time.  Psychiatric:        Mood and Affect: Mood normal.        Behavior: Behavior normal.     Assessment And Plan:     1. Encounter for general adult medical examination w/o abnormal findings Comments: A full exam was performed. Importance of monthly self breast exams was discussed with the patient. Pap smear also performed.  PATIENT IS ADVISED TO GET 30-45 MINUTES REGULAR EXERCISE NO LESS THAN FOUR TO FIVE DAYS PER WEEK - BOTH WEIGHTBEARING EXERCISES AND AEROBIC ARE RECOMMENDED.  PATIENT IS ADVISED TO FOLLOW A HEALTHY DIET WITH AT LEAST SIX FRUITS/VEGGIES PER DAY, DECREASE INTAKE OF RED MEAT, AND TO INCREASE FISH INTAKE TO TWO DAYS PER WEEK.  MEATS/FISH SHOULD NOT BE FRIED, BAKED OR BROILED IS PREFERABLE.  IT IS ALSO IMPORTANT TO CUT BACK ON YOUR SUGAR INTAKE. PLEASE AVOID ANYTHING WITH ADDED SUGAR, CORN SYRUP OR OTHER SWEETENERS. IF YOU MUST USE A SWEETENER, YOU CAN TRY STEVIA. IT IS ALSO IMPORTANT TO AVOID ARTIFICIALLY SWEETENERS AND DIET BEVERAGES. LASTLY, I SUGGEST WEARING SPF 50 SUNSCREEN ON EXPOSED PARTS AND  ESPECIALLY WHEN IN THE DIRECT SUNLIGHT FOR AN EXTENDED PERIOD OF TIME.  PLEASE AVOID FAST FOOD RESTAURANTS AND INCREASE YOUR WATER INTAKE. - Lipid panel - Hemoglobin A1c  2. Cervical smear, as part of routine gynecological examination Comments: Pap smear performed, stool heme negative.  - Cytology -Pap Smear  3. Essential hypertension, benign Comments: Chronic, well controlled. EKG performed, NSR w/o acute changes, but occ. ectopic ventricular beat. Advised to aim for at least 150 minutes of exercise per week. She will f/u in 6 months.  - POCT Urinalysis Dipstick (81002) - Microalbumin / Creatinine Urine Ratio - EKG 12-Lead - CBC - CMP14+EGFR  4. Vitamin D deficiency disease Comments: I will check vitamin D level and supplement as needed. - Vitamin D (25 hydroxy)  5. Herpes zoster vaccination declined  6. Class 1 obesity due to excess calories with serious comorbidity and body mass index (BMI) of 34.0 to 34.9 in adult She is encouraged to strive for BMI less than 30 to decrease cardiac risk. Advised to aim for at least 150 minutes of exercise per week.  Patient was given opportunity to ask questions. Patient verbalized understanding of the plan and was able to repeat key elements of the plan. All questions were answered to their satisfaction.   I, Maximino Greenland, MD, have reviewed all documentation for this visit. The documentation on 12/10/21 for the exam, diagnosis, procedures, and orders are all accurate and complete.   THE PATIENT IS ENCOURAGED TO PRACTICE SOCIAL DISTANCING DUE TO THE COVID-19 PANDEMIC.

## 2021-12-09 LAB — CMP14+EGFR
ALT: 17 IU/L (ref 0–32)
AST: 21 IU/L (ref 0–40)
Albumin/Globulin Ratio: 1.5 (ref 1.2–2.2)
Albumin: 4.4 g/dL (ref 3.8–4.9)
Alkaline Phosphatase: 57 IU/L (ref 44–121)
BUN/Creatinine Ratio: 21 (ref 9–23)
BUN: 16 mg/dL (ref 6–24)
Bilirubin Total: 0.3 mg/dL (ref 0.0–1.2)
CO2: 25 mmol/L (ref 20–29)
Calcium: 9.6 mg/dL (ref 8.7–10.2)
Chloride: 104 mmol/L (ref 96–106)
Creatinine, Ser: 0.78 mg/dL (ref 0.57–1.00)
Globulin, Total: 2.9 g/dL (ref 1.5–4.5)
Glucose: 79 mg/dL (ref 70–99)
Potassium: 4 mmol/L (ref 3.5–5.2)
Sodium: 142 mmol/L (ref 134–144)
Total Protein: 7.3 g/dL (ref 6.0–8.5)
eGFR: 87 mL/min/{1.73_m2} (ref >59)

## 2021-12-09 LAB — LIPID PANEL
Chol/HDL Ratio: 4.8 ratio — ABNORMAL HIGH (ref 0.0–4.4)
Cholesterol, Total: 182 mg/dL (ref 100–199)
HDL: 38 mg/dL — ABNORMAL LOW (ref >39)
LDL Chol Calc (NIH): 120 mg/dL — ABNORMAL HIGH (ref 0–99)
Triglycerides: 131 mg/dL (ref 0–149)
VLDL Cholesterol Cal: 24 mg/dL (ref 5–40)

## 2021-12-09 LAB — CBC
Hematocrit: 37.9 % (ref 34.0–46.6)
Hemoglobin: 12.9 g/dL (ref 11.1–15.9)
MCH: 31 pg (ref 26.6–33.0)
MCHC: 34 g/dL (ref 31.5–35.7)
MCV: 91 fL (ref 79–97)
Platelets: 252 10*3/uL (ref 150–450)
RBC: 4.16 x10E6/uL (ref 3.77–5.28)
RDW: 13.4 % (ref 11.7–15.4)
WBC: 5.6 10*3/uL (ref 3.4–10.8)

## 2021-12-09 LAB — VITAMIN D 25 HYDROXY (VIT D DEFICIENCY, FRACTURES): Vit D, 25-Hydroxy: 47.7 ng/mL (ref 30.0–100.0)

## 2021-12-09 LAB — MICROALBUMIN / CREATININE URINE RATIO
Creatinine, Urine: 102.7 mg/dL
Microalb/Creat Ratio: 20 mg/g creat (ref 0–29)
Microalbumin, Urine: 20.7 ug/mL

## 2021-12-09 LAB — HEMOGLOBIN A1C
Est. average glucose Bld gHb Est-mCnc: 120 mg/dL
Hgb A1c MFr Bld: 5.8 % — ABNORMAL HIGH (ref 4.8–5.6)

## 2021-12-09 NOTE — Progress Notes (Signed)
Your hba1c is 5.8, this is in prediabetes range. Be sure to cut out sugary beverages (including juice)  Your HDL is too low, this is your good cholesterol. Increase exercise and eat fish like salmon twice weekly. Your LDL, bad cholesterol has gone up. Cut back on fried foods.   Your vitamin D level is stable. Your blood count is normal. Your liver/kidney function are stable.   Please let me know if you have any questions.   RS

## 2021-12-10 LAB — POC HEMOCCULT BLD/STL (OFFICE/1-CARD/DIAGNOSTIC): Fecal Occult Blood, POC: NEGATIVE

## 2021-12-10 LAB — CYTOLOGY - PAP
Comment: NEGATIVE
Diagnosis: NEGATIVE
High risk HPV: NEGATIVE

## 2021-12-16 ENCOUNTER — Other Ambulatory Visit: Payer: Self-pay | Admitting: Internal Medicine

## 2022-02-27 ENCOUNTER — Other Ambulatory Visit: Payer: Self-pay | Admitting: Orthopedic Surgery

## 2022-05-12 ENCOUNTER — Other Ambulatory Visit: Payer: Self-pay | Admitting: Orthopedic Surgery

## 2022-05-20 ENCOUNTER — Other Ambulatory Visit: Payer: Self-pay | Admitting: Internal Medicine

## 2022-06-03 ENCOUNTER — Ambulatory Visit: Payer: BC Managed Care – PPO | Admitting: Internal Medicine

## 2022-06-03 ENCOUNTER — Encounter: Payer: Self-pay | Admitting: Internal Medicine

## 2022-06-03 VITALS — BP 140/80 | HR 77 | Temp 98.5°F | Ht 67.0 in | Wt 235.4 lb

## 2022-06-03 DIAGNOSIS — Z780 Asymptomatic menopausal state: Secondary | ICD-10-CM

## 2022-06-03 DIAGNOSIS — I10 Essential (primary) hypertension: Secondary | ICD-10-CM | POA: Diagnosis not present

## 2022-06-03 DIAGNOSIS — R7309 Other abnormal glucose: Secondary | ICD-10-CM

## 2022-06-03 DIAGNOSIS — Z6834 Body mass index (BMI) 34.0-34.9, adult: Secondary | ICD-10-CM

## 2022-06-03 DIAGNOSIS — Z20822 Contact with and (suspected) exposure to covid-19: Secondary | ICD-10-CM

## 2022-06-03 DIAGNOSIS — M5416 Radiculopathy, lumbar region: Secondary | ICD-10-CM | POA: Diagnosis not present

## 2022-06-03 DIAGNOSIS — Z2821 Immunization not carried out because of patient refusal: Secondary | ICD-10-CM

## 2022-06-03 DIAGNOSIS — E6609 Other obesity due to excess calories: Secondary | ICD-10-CM

## 2022-06-03 MED ORDER — TRIAMCINOLONE ACETONIDE 40 MG/ML IJ SUSP
60.0000 mg | Freq: Once | INTRAMUSCULAR | Status: AC
  Administered 2022-06-03: 60 mg via INTRAMUSCULAR

## 2022-06-03 MED ORDER — TIZANIDINE HCL 4 MG PO TABS: 4.0000 mg | ORAL_TABLET | Freq: Every day | ORAL | 0 refills | Status: DC | PRN

## 2022-06-03 NOTE — Patient Instructions (Signed)
Hypertension, Adult ?Hypertension is another name for high blood pressure. High blood pressure forces your heart to work harder to pump blood. This can cause problems over time. ?There are two numbers in a blood pressure reading. There is a top number (systolic) over a bottom number (diastolic). It is best to have a blood pressure that is below 120/80. ?What are the causes? ?The cause of this condition is not known. Some other conditions can lead to high blood pressure. ?What increases the risk? ?Some lifestyle factors can make you more likely to develop high blood pressure: ?Smoking. ?Not getting enough exercise or physical activity. ?Being overweight. ?Having too much fat, sugar, calories, or salt (sodium) in your diet. ?Drinking too much alcohol. ?Other risk factors include: ?Having any of these conditions: ?Heart disease. ?Diabetes. ?High cholesterol. ?Kidney disease. ?Obstructive sleep apnea. ?Having a family history of high blood pressure and high cholesterol. ?Age. The risk increases with age. ?Stress. ?What are the signs or symptoms? ?High blood pressure may not cause symptoms. Very high blood pressure (hypertensive crisis) may cause: ?Headache. ?Fast or uneven heartbeats (palpitations). ?Shortness of breath. ?Nosebleed. ?Vomiting or feeling like you may vomit (nauseous). ?Changes in how you see. ?Very bad chest pain. ?Feeling dizzy. ?Seizures. ?How is this treated? ?This condition is treated by making healthy lifestyle changes, such as: ?Eating healthy foods. ?Exercising more. ?Drinking less alcohol. ?Your doctor may prescribe medicine if lifestyle changes do not help enough and if: ?Your top number is above 130. ?Your bottom number is above 80. ?Your personal target blood pressure may vary. ?Follow these instructions at home: ?Eating and drinking ? ?If told, follow the DASH eating plan. To follow this plan: ?Fill one half of your plate at each meal with fruits and vegetables. ?Fill one fourth of your plate  at each meal with whole grains. Whole grains include whole-wheat pasta, brown rice, and whole-grain bread. ?Eat or drink low-fat dairy products, such as skim milk or low-fat yogurt. ?Fill one fourth of your plate at each meal with low-fat (lean) proteins. Low-fat proteins include fish, chicken without skin, eggs, beans, and tofu. ?Avoid fatty meat, cured and processed meat, or chicken with skin. ?Avoid pre-made or processed food. ?Limit the amount of salt in your diet to less than 1,500 mg each day. ?Do not drink alcohol if: ?Your doctor tells you not to drink. ?You are pregnant, may be pregnant, or are planning to become pregnant. ?If you drink alcohol: ?Limit how much you have to: ?0-1 drink a day for women. ?0-2 drinks a day for men. ?Know how much alcohol is in your drink. In the [place] one drink equals one 12 oz bottle of beer (355 mL), one 5 oz glass of wine (148 mL), or one 1? oz glass of hard liquor (44 mL). ?Lifestyle ? ?Work with your doctor to stay at a healthy weight or to lose weight. Ask your doctor what the best weight is for you. ?Get at least 30 minutes of exercise that causes your heart to beat faster (aerobic exercise) most days of the week. This may include walking, swimming, or biking. ?Get at least 30 minutes of exercise that strengthens your muscles (resistance exercise) at least 3 days a week. This may include lifting weights or doing Pilates. ?Do not smoke or use any products that contain nicotine or tobacco. If you need help quitting, ask your doctor. ?Check your blood pressure at home as told by your doctor. ?Keep all follow-up visits. ?Medicines ?Take over-the-counter and prescription medicines   only as told by your doctor. Follow directions carefully. ?Do not skip doses of blood pressure medicine. The medicine does not work as well if you skip doses. Skipping doses also puts you at risk for problems. ?Ask your doctor about side effects or reactions to medicines that you should watch  for. ?Contact a doctor if: ?You think you are having a reaction to the medicine you are taking. ?You have headaches that keep coming back. ?You feel dizzy. ?You have swelling in your ankles. ?You have trouble with your vision. ?Get help right away if: ?You get a very bad headache. ?You start to feel mixed up (confused). ?You feel weak or numb. ?You feel faint. ?You have very bad pain in your: ?Chest. ?Belly (abdomen). ?You vomit more than once. ?You have trouble breathing. ?These symptoms may be an emergency. Get help right away. Call 911. ?Do not wait to see if the symptoms will go away. ?Do not drive yourself to the hospital. ?Summary ?Hypertension is another name for high blood pressure. ?High blood pressure forces your heart to work harder to pump blood. ?For most people, a normal blood pressure is less than 120/80. ?Making healthy choices can help lower blood pressure. If your blood pressure does not get lower with healthy choices, you may need to take medicine. ?This information is not intended to replace advice given to you by your health care provider. Make sure you discuss any questions you have with your health care provider. ?Document Revised: 07/16/2021 Document Reviewed: 07/16/2021 ?Elsevier Patient Education ? 2023 [employer] Inc[employer] ? ?

## 2022-06-03 NOTE — Progress Notes (Signed)
Barnet Glasgow Martin,acting as a Education administrator for Maximino Greenland, MD.,have documented all relevant documentation on the behalf of Maximino Greenland, MD,as directed by  Maximino Greenland, MD while in the presence of Maximino Greenland, MD.    Subjective:     Patient ID: Sara Short , female    DOB: 09-26-1961 , 61 y.o.   MRN: [id number]   Chief Complaint  Patient presents with   Hypertension    HPI  Patient presents today for a BP check, patient reports compliance with medications. She denies headaches, chest pain and shortness of breath. Patient doesn't have any other concerns today. 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 (Works at [employer]). She does not have any sx at this time.   BP Readings from Last 3 Encounters: 06/03/22 : (!) 140/90 12/08/21 : 122/80 08/27/21 : 120/78    Hypertension This is a chronic problem. The current episode started more than 1 year ago. The problem has been gradually improving since onset. The problem is uncontrolled. Pertinent negatives include no blurred vision, chest pain, palpitations or shortness of breath. Risk factors for coronary artery disease include obesity, post-menopausal state and sedentary lifestyle. The current treatment provides moderate improvement. Compliance problems include exercise.      Past Medical History:  Diagnosis Date   Allergic rhinitis    HTN (hypertension)    Obesity      Family History  Problem Relation Age of Onset   Diverticulitis Mother    Hypertension Mother    Diabetes Father      Current Outpatient Medications:    Chlorpheniramine Maleate (ALLERGY PO), Take by mouth., Disp: , Rfl:    Cholecalciferol (VITAMIN D3) 25 MCG (1000 UT) CAPS, Take 2 capsules (2,000 Units total) by mouth daily. 1 per day, Disp: 60 capsule, Rfl: 1   Magnesium 400 MG CAPS, Take 400 mg by mouth Nightly., Disp: 90 capsule, Rfl: 1   Multiple Vitamin (MULTIVITAMIN)  capsule, Take 1 capsule by mouth daily., Disp: , Rfl:    nystatin cream (MYCOSTATIN), APPLY TOPICALLY TO THE AFFECTED AREA TWICE DAILY, Disp: 30 g, Rfl: 0   predniSONE (DELTASONE) 10 MG tablet, TAKE 1 TABLET(10 MG) BY MOUTH DAILY WITH BREAKFAST, Disp: 30 tablet, Rfl: 0   simvastatin (ZOCOR) 20 MG tablet, TAKE 1 TABLET(20 MG) BY MOUTH AT BEDTIME, Disp: 90 tablet, Rfl: 2   telmisartan-hydrochlorothiazide (MICARDIS HCT) 80-25 MG tablet, TAKE 1 TABLET BY MOUTH DAILY, Disp: 90 tablet, Rfl: 2   tiZANidine (ZANAFLEX) 4 MG tablet, Take 1 tablet (4 mg total) by mouth daily as needed for muscle spasms., Disp: 30 tablet, Rfl: 0   nystatin (NYSTATIN) powder, Apply 1 application topically 3 (three) times daily. (Patient not taking: Reported on 12/08/2021), Disp: 60 g, Rfl: 0   Allergies  Allergen Reactions   Crestor [Rosuvastatin Calcium] Hives   Lipitor [Atorvastatin Calcium] Hives     Review of Systems  Constitutional: Negative.   HENT: Negative.    Eyes: Negative.  Negative for blurred vision.  Respiratory: Negative.  Negative for shortness of breath.   Cardiovascular: Negative.  Negative for chest pain and palpitations.  Gastrointestinal: Negative.   Musculoskeletal:  Positive for back pain.       She c/o worsening LBP - originally seen by Dr. Sharol Given.      Today's Vitals   06/03/22 1422 06/03/22 1509  BP: (!) 140/90 (!) 140/80  Pulse:  77   Temp: 98.5 F (36.9 C)   TempSrc: Oral   Weight: 235 lb 6.4 oz (106.8 kg)   Height: _0  (1.702 m)   PainSc: 0-No pain    Body mass index is 36.87 kg/m.  Wt Readings from Last 3 Encounters:  06/03/22 235 lb 6.4 oz (106.8 kg)  12/08/21 226 lb 6.4 oz (102.7 kg)  08/27/21 228 lb 9.6 oz (103.7 kg)    BP Readings from Last 3 Encounters:  06/03/22 (!) 140/80  12/08/21 122/80  08/27/21 120/78     Objective:  Physical Exam Vitals and nursing note reviewed.  Constitutional:      Appearance: Normal appearance. She is obese.  HENT:     Head:  Normocephalic and atraumatic.  Eyes:     Extraocular Movements: Extraocular movements intact.  Cardiovascular:     Rate and Rhythm: Normal rate and regular rhythm.     Heart sounds: Normal heart sounds.  Pulmonary:     Effort: Pulmonary effort is normal.     Breath sounds: Normal breath sounds.  Musculoskeletal:        General: Tenderness present.     Cervical back: Normal range of motion.     Comments: Decreased ROM of lower back--flexion/extension Neg straight leg test  Skin:    General: Skin is warm.  Neurological:     General: No focal deficit present.     Mental Status: She is alert.  Psychiatric:        Mood and Affect: Mood normal.        Behavior: Behavior normal.      Assessment And Plan:     1. Essential hypertension, benign Comments: Chronic, uncontrolled. Possibly exacerbated by pain. NO med changes today.  - CMP14+EGFR  2. Lumbar radiculopathy Comments: Acute exacerbation of chronic condition. She was given Kenalog, 6m IM x 1 and rx tizanidine to use nightly prn. I will also refer for MRI L-Spine.  - MR Lumbar Spine Wo Contrast; Future - triamcinolone acetonide (KENALOG-40) injection 60 mg  3. Other abnormal glucose Comments: Her a1c has been elevated, I will recheck this today. She is encouraged to avoid sugary beverages and foods, including diet drinks.  - Hemoglobin A1c  4. Class 1 obesity due to excess calories with serious comorbidity and body mass index (BMI) of 34.0 to 34.9 in adult Comments: She was advised of 9lb weight gain and encouraged to incorporate more exercise into her daily routine, aiming for at least 150 minutes exercise per week.   5. Postmenopausal Comments: She requests confirmation of postmenopausal status.  She has not had a cycle since March 2022.  I will check FArchbaldtoday. - FSH  6. Immunization declined  7. Exposure to COVID-19 virus Comments: COVID testing performed as requested.  - Novel Coronavirus, NAA ([facility])    Patient was given opportunity to ask questions. Patient verbalized understanding of the plan and was able to repeat key elements of the plan. All questions were answered to their satisfaction.   I, RMaximino Greenland MD, have reviewed all documentation for this visit. The documentation on 06/03/22 for the exam, diagnosis, procedures, and orders are all accurate and complete.   IF YOU HAVE BEEN REFERRED TO A SPECIALIST, IT MAY TAKE 1-2 WEEKS TO SCHEDULE/PROCESS THE REFERRAL. IF YOU HAVE NOT HEARD FROM US/SPECIALIST IN TWO WEEKS, PLEASE GIVE [place] CALL AT [phone] X [phone].   THE PATIENT IS ENCOURAGED TO PRACTICE SOCIAL DISTANCING DUE TO THE COVID-19 PANDEMIC.

## 2022-06-04 LAB — CMP14+EGFR
ALT: 20 IU/L (ref 0–32)
AST: 23 IU/L (ref 0–40)
Albumin/Globulin Ratio: 1.5 (ref 1.2–2.2)
Albumin: 4.3 g/dL (ref 3.8–4.9)
Alkaline Phosphatase: 68 IU/L (ref 44–121)
BUN/Creatinine Ratio: 22 (ref 12–28)
BUN: 16 mg/dL (ref 8–27)
Bilirubin Total: 0.4 mg/dL (ref 0.0–1.2)
CO2: 24 mmol/L (ref 20–29)
Calcium: 10 mg/dL (ref 8.7–10.3)
Chloride: 102 mmol/L (ref 96–106)
Creatinine, Ser: 0.72 mg/dL (ref 0.57–1.00)
Globulin, Total: 2.9 g/dL (ref 1.5–4.5)
Glucose: 92 mg/dL (ref 70–99)
Potassium: 3.8 mmol/L (ref 3.5–5.2)
Sodium: 142 mmol/L (ref 134–144)
Total Protein: 7.2 g/dL (ref 6.0–8.5)
eGFR: 96 mL/min/{1.73_m2} (ref >59)

## 2022-06-04 LAB — HEMOGLOBIN A1C
Est. average glucose Bld gHb Est-mCnc: 117 mg/dL
Hgb A1c MFr Bld: 5.7 % — ABNORMAL HIGH (ref 4.8–5.6)

## 2022-06-04 LAB — FOLLICLE STIMULATING HORMONE: FSH: 67.7 m[IU]/mL

## 2022-06-09 ENCOUNTER — Ambulatory Visit: Payer: BC Managed Care – PPO | Admitting: Internal Medicine

## 2022-06-29 ENCOUNTER — Ambulatory Visit: Payer: BC Managed Care – PPO

## 2022-06-29 VITALS — BP 138/78 | HR 89 | Temp 98.6°F

## 2022-06-29 DIAGNOSIS — I10 Essential (primary) hypertension: Secondary | ICD-10-CM

## 2022-06-29 MED ORDER — AMLODIPINE BESYLATE 2.5 MG PO TABS: 2.5000 mg | ORAL_TABLET | Freq: Every evening | ORAL | 2 refills | Status: DC

## 2022-06-29 NOTE — Progress Notes (Signed)
Patient presents today for BPC. She is currently taking telmisartan-hctz 80-25mg .   BP Readings from Last 3 Encounters:  06/29/22 138/78  06/03/22 (!) 140/80  12/08/21 122/80   Provider would like to start amlopidine 2.5 nightly. Pt will return in 2 weeks for nurse visit. Pt advised to increase water intake and decrease salt intake.

## 2022-07-01 ENCOUNTER — Ambulatory Visit: Payer: BC Managed Care – PPO | Admitting: Orthopedic Surgery

## 2022-07-01 DIAGNOSIS — M79605 Pain in left leg: Secondary | ICD-10-CM | POA: Diagnosis not present

## 2022-07-01 DIAGNOSIS — M7662 Achilles tendinitis, left leg: Secondary | ICD-10-CM | POA: Diagnosis not present

## 2022-07-01 MED ORDER — PREDNISONE 10 MG PO TABS
ORAL_TABLET | ORAL | 0 refills | Status: DC
Start: 2022-07-01 — End: 2022-08-10

## 2022-07-11 ENCOUNTER — Encounter: Payer: Self-pay | Admitting: Orthopedic Surgery

## 2022-07-11 NOTE — Progress Notes (Signed)
Office Visit Note   Patient: Sara Short           Date of Birth: 04-08-1961           MRN: [id number] Visit Date: 07/01/2022              Requested by: Glendale Chard, [place] [place],  [place] [id number] PCP: Glendale Chard, MD  Chief Complaint  Patient presents with   Left Achilles Tendon - Pain     HPI: Patient is a 61 year old woman who presents in follow-up for left acute Achilles tendinitis.  Patient denies any specific injury.  Patient states she is on her feet a lot at work and has progressive pain each day.  Patient states she has had a history of sciatic nerve pain but does not have it now.  Patient states she feels a pulling sensation in the Achilles tendon when she walks.  Patient has no history of gout no history of using fluoroquinolones.  Assessment & Plan: Visit Diagnoses:  1. Pain in left leg   2. Achilles tendinitis, left leg     Plan: Patient states she cannot wear the fracture boot at work.  We will have her continue with the fracture boot heel lifts and prednisone 10 mg with breakfast.  Out of work until reevaluated in 4 weeks.  Follow-Up Instructions: Return in about 4 weeks (around 07/29/2022).   Ortho Exam  Patient is alert, oriented, no adenopathy, well-dressed, normal affect, normal respiratory effort. Examination patient has a good dorsalis pedis pulse she does have a palpable nodule mid substance of the Achilles no palpable defect this is tender to palpation.  Compression of the calf reproduces plantarflexion of the foot no Achilles rupture.  She has a negative straight leg raise and motor strength is symmetric without weakness.  Imaging: No results found. No images are attached to the encounter.  Labs: Lab Results  Component Value Date   HGBA1C 5.7 (H) 06/03/2022   HGBA1C 5.8 (H) 12/08/2021   HGBA1C 5.6 09/24/2019     Lab Results  Component Value Date   ALBUMIN 4.3 06/03/2022   ALBUMIN 4.4 12/08/2021    ALBUMIN 3.9 06/01/2021    No results found for: "MG" Lab Results  Component Value Date   VD25OH 47.7 12/08/2021   VD25OH 46.8 12/01/2020   VD25OH 41.1 03/24/2020    No results found for: "PREALBUMIN"    Latest Ref Rng & Units 12/08/2021   11:53 AM 12/01/2020    3:54 PM 09/24/2019    3:57 PM  CBC EXTENDED  WBC 3.4 - 10.8 x10E3/uL 5.6  5.5  4.8   RBC 3.77 - 5.28 x10E6/uL 4.16  4.09  4.24   Hemoglobin 11.1 - 15.9 g/dL 12.9  12.3  12.9   HCT 34.0 - 46.6 % 37.9  37.3  37.9   Platelets 150 - 450 x10E3/uL 252  284  271      There is no height or weight on file to calculate BMI.  Orders:  No orders of the defined types were placed in this encounter.  Meds ordered this encounter  Medications   predniSONE (DELTASONE) 10 MG tablet    Sig: TAKE 1 TABLET(10 MG) BY MOUTH DAILY WITH BREAKFAST    Dispense:  30 tablet    Refill:  0     Procedures: No procedures performed  Clinical Data: No additional findings.  ROS:  All other systems negative, except as noted in the HPI. Review  of Systems  Objective: Vital Signs: LMP 12/24/2020   Specialty Comments:  No specialty comments available.  PMFS History: Patient Active Problem List   Diagnosis Date Noted   Lumbar radiculopathy 06/03/2022   Perimenopause 08/27/2021   Tachycardia 08/27/2021   Class 1 obesity due to excess calories with serious comorbidity and body mass index (BMI) of 34.0 to 34.9 in adult 08/27/2021   Essential hypertension, benign 01/02/2019   Primary insomnia 01/02/2019   Past Medical History:  Diagnosis Date   Allergic rhinitis    HTN (hypertension)    Obesity     Family History  Problem Relation Age of Onset   Diverticulitis Mother    Hypertension Mother    Diabetes Father     Past Surgical History:  Procedure Laterality Date   ECTOPIC PREGNANCY SURGERY  2003   Social History   Occupational History   Not on file  Tobacco Use   Smoking status: Former    Packs/day: 0.50    Years: 20.00     Total pack years: 10.00    Types: Cigarettes    Quit date: 03/12/2019    Years since quitting: 3.3   Smokeless tobacco: Never   Tobacco comments:    She quit in June  Vaping Use   Vaping Use: Never used  Substance and Sexual Activity   Alcohol use: Never   Drug use: Never   Sexual activity: Not on file

## 2022-07-13 ENCOUNTER — Ambulatory Visit: Payer: BC Managed Care – PPO

## 2022-07-13 VITALS — BP 130/72 | HR 84 | Temp 98.1°F

## 2022-07-13 DIAGNOSIS — I10 Essential (primary) hypertension: Secondary | ICD-10-CM

## 2022-07-13 NOTE — Progress Notes (Signed)
Patient presents today for BPC. She is currently taking amlodipine 2.5 and telmisartan-hctz 80-25. She also takes magnesium nightly. Pt states that she did not sleep well last night so she is not her best. She also declines all vaccines this visit. Pt states that she has done better by her diet and exercise.   BP Readings from Last 3 Encounters:  07/13/22 130/72  06/29/22 138/78  06/03/22 (!) 140/80   Pt advised to take telmisartan-hctz when she wakes and amlodipine when she is going to bed. Pt will follow up with provider as directed.

## 2022-07-26 ENCOUNTER — Encounter: Payer: Self-pay | Admitting: Orthopedic Surgery

## 2022-07-26 ENCOUNTER — Ambulatory Visit (INDEPENDENT_AMBULATORY_CARE_PROVIDER_SITE_OTHER): Payer: BC Managed Care – PPO | Admitting: Orthopedic Surgery

## 2022-07-26 DIAGNOSIS — M7662 Achilles tendinitis, left leg: Secondary | ICD-10-CM

## 2022-07-26 NOTE — Progress Notes (Signed)
Office Visit Note   Patient: Sara Short           Date of Birth: 1960/11/07           MRN: [id number] Visit Date: 07/26/2022              Requested by: Dorothyann Peng, MD [address] [place],  [place] [address] PCP: Dorothyann Peng, MD  Chief Complaint  Patient presents with   Left Leg - Pain      HPI: Patient is a 61 year old woman who presents in follow-up for Achilles tendinitis on the left.  Patient has been in a fracture boot but states that she has difficulty performing her work duties with the boot on.  Assessment & Plan: Visit Diagnoses:  1. Achilles tendinitis, left leg     Plan: Patient is provided a note to be out of work for 4 weeks.  We will have her follow-up with Dr. Shon Baton for evaluation for extracorporeal shockwave therapy.  In the meantime recommended Voltaren gel and dorsiflexion stretching exercises  Follow-Up Instructions: Return if symptoms worsen or fail to improve.   Ortho Exam  Patient is alert, oriented, no adenopathy, well-dressed, normal affect, normal respiratory effort. Examination patient has a palpable pulse she has nodular Achilles tendinitis on the left which is tender to palpation.  The Achilles is intact.  She has dorsiflexion 20 degrees short of neutral with the knee extended.  No recent history of fluoroquinolones.  Imaging: No results found. No images are attached to the encounter.  Labs: Lab Results  Component Value Date   HGBA1C 5.7 (H) 06/03/2022   HGBA1C 5.8 (H) 12/08/2021   HGBA1C 5.6 09/24/2019     Lab Results  Component Value Date   ALBUMIN 4.3 06/03/2022   ALBUMIN 4.4 12/08/2021   ALBUMIN 3.9 06/01/2021    No results found for: "MG" Lab Results  Component Value Date   VD25OH 47.7 12/08/2021   VD25OH 46.8 12/01/2020   VD25OH 41.1 03/24/2020    No results found for: "PREALBUMIN"    Latest Ref Rng & Units 12/08/2021   11:53 AM 12/01/2020    3:54 PM 09/24/2019    3:57 PM  CBC EXTENDED   WBC 3.4 - 10.8 x10E3/uL 5.6  5.5  4.8   RBC 3.77 - 5.28 x10E6/uL 4.16  4.09  4.24   Hemoglobin 11.1 - 15.9 g/dL 06.2  37.6  28.3   HCT 34.0 - 46.6 % 37.9  37.3  37.9   Platelets 150 - 450 x10E3/uL 252  284  271      There is no height or weight on file to calculate BMI.  Orders:  No orders of the defined types were placed in this encounter.  No orders of the defined types were placed in this encounter.    Procedures: No procedures performed  Clinical Data: No additional findings.  ROS:  All other systems negative, except as noted in the HPI. Review of Systems  Objective: Vital Signs: LMP 12/24/2020   Specialty Comments:  No specialty comments available.  PMFS History: Patient Active Problem List   Diagnosis Date Noted   Lumbar radiculopathy 06/03/2022   Perimenopause 08/27/2021   Tachycardia 08/27/2021   Class 1 obesity due to excess calories with serious comorbidity and body mass index (BMI) of 34.0 to 34.9 in adult 08/27/2021   Essential hypertension, benign 01/02/2019   Primary insomnia 01/02/2019   Past Medical History:  Diagnosis Date   Allergic rhinitis  HTN (hypertension)    Obesity     Family History  Problem Relation Age of Onset   Diverticulitis Mother    Hypertension Mother    Diabetes Father     Past Surgical History:  Procedure Laterality Date   ECTOPIC PREGNANCY SURGERY  2003   Social History   Occupational History   Not on file  Tobacco Use   Smoking status: Former    Packs/day: 0.50    Years: 20.00    Total pack years: 10.00    Types: Cigarettes    Quit date: 03/12/2019    Years since quitting: 3.3   Smokeless tobacco: Never   Tobacco comments:    She quit in June  Vaping Use   Vaping Use: Never used  Substance and Sexual Activity   Alcohol use: Never   Drug use: Never   Sexual activity: Not on file

## 2022-07-27 ENCOUNTER — Telehealth: Payer: Self-pay | Admitting: Orthopedic Surgery

## 2022-07-27 NOTE — Telephone Encounter (Signed)
FMLA forms received. To [employer]. 

## 2022-08-03 ENCOUNTER — Ambulatory Visit (INDEPENDENT_AMBULATORY_CARE_PROVIDER_SITE_OTHER): Payer: BC Managed Care – PPO | Admitting: Sports Medicine

## 2022-08-03 ENCOUNTER — Encounter: Payer: Self-pay | Admitting: Sports Medicine

## 2022-08-03 DIAGNOSIS — M79605 Pain in left leg: Secondary | ICD-10-CM | POA: Diagnosis not present

## 2022-08-03 DIAGNOSIS — M7662 Achilles tendinitis, left leg: Secondary | ICD-10-CM | POA: Diagnosis not present

## 2022-08-03 NOTE — Progress Notes (Signed)
Left achilles pain; referral from Dr. Sharol Given  Here for SWT consult

## 2022-08-03 NOTE — Progress Notes (Signed)
Sara Short - 61 y.o. female MRN [id number]  Date of birth: 17-Jul-1961  Office Visit Note: Visit Date: 08/03/2022 PCP: Glendale Chard, MD Referred by: Glendale Chard, MD  Subjective: Chief Complaint  Patient presents with   Left Foot - Pain   HPI: Sara Short is a pleasant 61 y.o. female who presents today for left achilles tendinitis/tendinopathy.  She was seen by Dr. Sharol Given on 07/26/2022 and diagnosed with Achilles tendinitis.  He did place her into a short leg walking boot for her to wear all throughout the day.  Did provide a note for work at that time.  He also recommended Voltaren gel and dorsiflexion stretching exercises.  Was interested in getting her into shockwave therapy.  Patient continues with some of the pain, pointing to the mid substance of the Achilles.  Denies any specific injury.  She does report she was given a prednisone medication, but has not taken it yet.   Pertinent ROS were reviewed with the patient and found to be negative unless otherwise specified above in HPI.   Assessment & Plan: Visit Diagnoses:  1. Achilles tendinitis, left leg   2. Pain in left leg    Plan: Discussed with Jasneet she needs to continue in the boot to calm down the Achilles tendon.  We did do a trial of extracorporeal shockwave therapy today, she will present for follow-up in 1 week and we will repeat this treatment.  At that visit, we will also schedule her for an ultrasound to evaluate the Achilles tendon and surrounding structures.  He may continue the prednisone pack that was previously prescribed by Dr. Sharol Given.  Other possible treatment modalities could consider nitroglycerin patch protocol if shockwave therapy does not get her where she needs to be.  Continue HEP. F/u 1 week for [name] and ECSWT.  Follow-up: Return in about 1 week (around 08/10/2022) for with Dr. Rolena Infante for [name] and [name].   Meds & Orders: No orders of the defined types were placed in this encounter.   No orders of the defined types were placed in this encounter.    Procedures:  Procedure: ECSWT Indications:  Achilles tendinitis/tendinopathy   Procedure Details Consent: Risks of procedure as well as the alternatives and risks of each were explained to the patient.  Verbal consent for procedure obtained. Time Out: Verified patient identification, verified procedure, site was marked, verified correct patient position. The area was cleaned with alcohol swab.     The left achilles tendon was targeted for Extracorporeal shockwave therapy.    Preset: Patellar tendinitis Power Level: 100 mJ Frequency: 12 Hz Impulse/cycles: 2200 Head size: Regular   Patient tolerated procedure well without immediate complications.  *120 mJ and 14 Hz was a little too painful for patient, so weaned down. Will consider slow build-up at future visits if tolerates.        Clinical History: No specialty comments available.  She reports that she quit smoking about 3 years ago. Her smoking use included cigarettes. She has a 10.00 pack-year smoking history. She has never used smokeless tobacco.  Recent Labs    12/08/21 1153 06/03/22 1527  HGBA1C 5.8* 5.7*    Objective:   Vital Signs: LMP 12/24/2020   Physical Exam  Gen: Well-appearing, in no acute distress; non-toxic VO:[id number]. Warm.  Resp: Breathing unlabored on room air; no wheezing. Psych: Fluid speech in conversation; appropriate affect; normal thought process Neuro: Sensation intact throughout. No gross coordination deficits.   Ortho Exam - Left achilles/leg:  She has a nodular thickening in the mid substance of the Achilles tendon which is tender to palpation.  No TTP at the calcaneal insertion or the myotendinous junction proximally.  She has some issue with endrange dorsiflexion.  Thompson test negative, plantarflexion intact.  No redness or erythema.  Neurovascularly intact distally.  Imaging: No results found.  Past  Medical/Family/Surgical/Social History: Medications & Allergies reviewed per EMR, new medications updated. Patient Active Problem List   Diagnosis Date Noted   Lumbar radiculopathy 06/03/2022   Perimenopause 08/27/2021   Tachycardia 08/27/2021   Class 1 obesity due to excess calories with serious comorbidity and body mass index (BMI) of 34.0 to 34.9 in adult 08/27/2021   Essential hypertension, benign 01/02/2019   Primary insomnia 01/02/2019   Past Medical History:  Diagnosis Date   Allergic rhinitis    HTN (hypertension)    Obesity    Family History  Problem Relation Age of Onset   Diverticulitis Mother    Hypertension Mother    Diabetes Father    Past Surgical History:  Procedure Laterality Date   ECTOPIC PREGNANCY SURGERY  2003   Social History   Occupational History   Not on file  Tobacco Use   Smoking status: Former    Packs/day: 0.50    Years: 20.00    Total pack years: 10.00    Types: Cigarettes    Quit date: 03/12/2019    Years since quitting: 3.3   Smokeless tobacco: Never   Tobacco comments:    She quit in June  Vaping Use   Vaping Use: Never used  Substance and Sexual Activity   Alcohol use: Never   Drug use: Never   Sexual activity: Not on file

## 2022-08-06 ENCOUNTER — Other Ambulatory Visit: Payer: Self-pay | Admitting: Orthopedic Surgery

## 2022-08-09 ENCOUNTER — Telehealth: Payer: Self-pay | Admitting: Orthopedic Surgery

## 2022-08-09 NOTE — Telephone Encounter (Signed)
Pt called requesting a refill of prednisone. Pt states pharmacy sent in a request. Please send to pharmacy on file. Pt phone number is [phone].

## 2022-08-10 ENCOUNTER — Ambulatory Visit (INDEPENDENT_AMBULATORY_CARE_PROVIDER_SITE_OTHER): Payer: BC Managed Care – PPO | Admitting: Sports Medicine

## 2022-08-10 ENCOUNTER — Ambulatory Visit: Payer: Self-pay

## 2022-08-10 ENCOUNTER — Encounter: Payer: Self-pay | Admitting: Sports Medicine

## 2022-08-10 ENCOUNTER — Telehealth: Payer: Self-pay

## 2022-08-10 ENCOUNTER — Other Ambulatory Visit: Payer: Self-pay | Admitting: Internal Medicine

## 2022-08-10 ENCOUNTER — Telehealth: Payer: Self-pay | Admitting: Sports Medicine

## 2022-08-10 DIAGNOSIS — M7662 Achilles tendinitis, left leg: Secondary | ICD-10-CM | POA: Diagnosis not present

## 2022-08-10 DIAGNOSIS — Z1231 Encounter for screening mammogram for malignant neoplasm of breast: Secondary | ICD-10-CM

## 2022-08-10 DIAGNOSIS — M79605 Pain in left leg: Secondary | ICD-10-CM | POA: Diagnosis not present

## 2022-08-10 MED ORDER — NITROGLYCERIN 0.2 MG/HR TD PT24: MEDICATED_PATCH | TRANSDERMAL | 1 refills | Status: DC

## 2022-08-10 NOTE — Telephone Encounter (Signed)
Patient said pharmacy is wait on instructrions for the path that was sent in today

## 2022-08-10 NOTE — Progress Notes (Signed)
Sara Short - 61 y.o. female MRN [id number]  Date of birth: 27-Mar-1961  Office Visit Note: Visit Date: 08/10/2022 PCP: Glendale Chard, MD Referred by: Glendale Chard, MD  Subjective: Chief Complaint  Patient presents with   Left Achilles Tendon - Pain   HPI: Sara Short is a pleasant 61 y.o. female who presents today for follow-up of left Achilles tendinopathy/tendinitis.  She has remained in the walking boot, but reports she is not able to wear this during work so she still continues on her feet out of this.  Does wear the boot during the day.  She has been doing some of the stretching exercises, although not daily.  She does report she got some relief immediately following the first shockwave treatment, she is interested in repeating this again today.  Has not yet followed up with Dr. Sharol Given.  Pertinent ROS were reviewed with the patient and found to be negative unless otherwise specified above in HPI.   Assessment & Plan: Visit Diagnoses:  1. Achilles tendinitis, left leg   2. Pain in left leg    Plan: We did ultrasound the Achilles which shows an acute on chronic tendinopathy with active inflammation and notable thickening of the mid substance of the Achilles tendon.  There is no evidence of tearing or insertional irregularity.  She did find benefit from the shockwave therapy, we repeated a second treatment today.  Did encourage her that she needs to be consistent with the stretching and therapy exercises that Dr. Sharol Given had showed her previously.  If not finding progress, may consider formalized physical therapy.  We also discussed the option of nitroglycerin patch protocol to help aid in the healing process, she is agreeable to try this. R/B/I discussed.  We will get her back for her routine monthly follow-up with Dr. Sharol Given to reevaluate, and then if she wishes to continue shockwave treatments or additional treatment modalities I am happy to continue this.   Follow-up:  Return for with Dr. Sharol Given to recheck achilles; may continue shockwave if finds beneficial.   Meds & Orders:  Meds ordered this encounter  Medications   nitroGLYCERIN (NITRODUR - DOSED IN MG/24 HR) 0.2 mg/hr patch    Sig: Place 1/4 patch on affected area of achilles.    Dispense:  30 patch    Refill:  1    Orders Placed This Encounter  Procedures   [name] Extrem Low Left Ltd     Procedures: Procedure: ECSWT Indications:  Achilles tendinitis/tendinopathy   Procedure Details Consent: Risks of procedure as well as the alternatives and risks of each were explained to the patient.  Verbal consent for procedure obtained. Time Out: Verified patient identification, verified procedure, site was marked, verified correct patient position. The area was cleaned with alcohol swab.     The left achilles tendon was targeted for Extracorporeal shockwave therapy.    Preset: Patellar tendinitis Power Level: 110 mJ Frequency: 11 Hz Impulse/cycles: 2500 Head size: Regular   Patient tolerated procedure well without immediate complications.         Clinical History: No specialty comments available.  She reports that she quit smoking about 3 years ago. Her smoking use included cigarettes. She has a 10.00 pack-year smoking history. She has never used smokeless tobacco.  Recent Labs    12/08/21 1153 06/03/22 1527  HGBA1C 5.8* 5.7*    Objective:   Vital Signs: LMP 12/24/2020   Physical Exam  Gen: Well-appearing, in no acute distress; non-toxic CV: Regular  Rate. Well-perfused. Warm.  Resp: Breathing unlabored on room air; no wheezing. Psych: Fluid speech in conversation; appropriate affect; normal thought process Neuro: Sensation intact throughout. No gross coordination deficits.   Ortho Exam - LLE: She has nodular thickening and some mild swelling around the mid substance of the posterior Achilles tendon, this is tender to palpation.  Negative calcaneal squeeze test.  She has an intact  Thompson's test.  Some mild restriction in dorsiflexion, intact plantarflexion.  No redness or erythema.  Neurovascular intact distally.  Walks with a mildly antalgic gait.  Imaging: [name] Extrem Low Left Ltd  Result Date: 08/10/2022 Limited evaluation of the Achilles, calcaneus and left lower extremity was performed today.  Ultrasound shows both long and short axis Achilles insertion into the posterior calcaneus without evidence of tearing or tendon abnormality near the insertion.  No calcaneal irregularity.  Scanning proximally to the mid belly of the Achilles tendon there is significant tendinopathic change with thickening of the tendon, 1.37 cm in thickness with hypoechoic fluid and hyperemia indicative of acute Achilles tendinitis as well.  There is no evidence of tearing skinning all the way up to the myotendinous junction.        Past Medical/Family/Surgical/Social History: Medications & Allergies reviewed per EMR, new medications updated. Patient Active Problem List   Diagnosis Date Noted   Lumbar radiculopathy 06/03/2022   Perimenopause 08/27/2021   Tachycardia 08/27/2021   Class 1 obesity due to excess calories with serious comorbidity and body mass index (BMI) of 34.0 to 34.9 in adult 08/27/2021   Essential hypertension, benign 01/02/2019   Primary insomnia 01/02/2019   Past Medical History:  Diagnosis Date   Allergic rhinitis    HTN (hypertension)    Obesity    Family History  Problem Relation Age of Onset   Diverticulitis Mother    Hypertension Mother    Diabetes Father    Past Surgical History:  Procedure Laterality Date   ECTOPIC PREGNANCY SURGERY  2003   Social History   Occupational History   Not on file  Tobacco Use   Smoking status: Former    Packs/day: 0.50    Years: 20.00    Total pack years: 10.00    Types: Cigarettes    Quit date: 03/12/2019    Years since quitting: 3.4   Smokeless tobacco: Never   Tobacco comments:    She quit in June  Vaping Use    Vaping Use: Never used  Substance and Sexual Activity   Alcohol use: Never   Drug use: Never   Sexual activity: Not on file

## 2022-08-10 NOTE — Telephone Encounter (Signed)
Pharm called with question about directions for medication sent in today. Message was sent directly to vm.

## 2022-08-10 NOTE — Telephone Encounter (Signed)
Prednisone sent today by Junie Panning

## 2022-08-10 NOTE — Patient Instructions (Signed)

## 2022-08-10 NOTE — Progress Notes (Signed)
Doing ok, still having some pain

## 2022-08-11 NOTE — Telephone Encounter (Signed)
Called [facility] and they said that they rec'd a fax this morning and have no questions.

## 2022-08-23 ENCOUNTER — Ambulatory Visit (INDEPENDENT_AMBULATORY_CARE_PROVIDER_SITE_OTHER): Payer: BC Managed Care – PPO | Admitting: Orthopedic Surgery

## 2022-08-23 ENCOUNTER — Encounter: Payer: Self-pay | Admitting: Orthopedic Surgery

## 2022-08-23 DIAGNOSIS — M7662 Achilles tendinitis, left leg: Secondary | ICD-10-CM | POA: Diagnosis not present

## 2022-08-23 NOTE — Progress Notes (Signed)
Office Visit Note   Patient: Sara Short           Date of Birth: [birth year]           MRN: [id number] Visit Date: 08/23/2022              Requested by: Dorothyann Peng, MD [address] [place],  [place] [address] PCP: Dorothyann Peng, MD  Chief Complaint  Patient presents with   Left Leg - Follow-up      HPI: Patient is a 61 year old woman who had insertional Achilles tendinitis.  She is referred to Dr. Shon Baton and has undergone 2 shockwave therapies.  She is also using a full nitroglycerin patch changing daily over the Achilles.  Patient states she is doing much better.  She states she notices decreased swelling.  Assessment & Plan: Visit Diagnoses:  1. Achilles tendinitis, left leg     Plan: Recommended using a 916 since heel lift in her boot to unload the Achilles she is to continue with her stretching and she is given a note to stay out of work for an additional 4 weeks.  Follow-Up Instructions: Return in about 4 weeks (around 09/20/2022).   Ortho Exam  Patient is alert, oriented, no adenopathy, well-dressed, normal affect, normal respiratory effort. Examination radiograph shows a Haglund deformity without calcification of the Achilles.  She still has some swelling over the Achilles but this is not tender to palpation and she has improved range of motion of the ankle and improve strength with plantarflexion.  Imaging: No results found. No images are attached to the encounter.  Labs: Lab Results  Component Value Date   HGBA1C 5.7 (H) 06/03/2022   HGBA1C 5.8 (H) 12/08/2021   HGBA1C 5.6 09/24/2019     Lab Results  Component Value Date   ALBUMIN 4.3 06/03/2022   ALBUMIN 4.4 12/08/2021   ALBUMIN 3.9 06/01/2021    No results found for: "MG" Lab Results  Component Value Date   VD25OH 47.7 12/08/2021   VD25OH 46.8 12/01/2020   VD25OH 41.1 03/24/2020    No results found for: "PREALBUMIN"    Latest Ref Rng & Units 12/08/2021   11:53 AM  12/01/2020    3:54 PM 09/24/2019    3:57 PM  CBC EXTENDED  WBC 3.4 - 10.8 x10E3/uL 5.6  5.5  4.8   RBC 3.77 - 5.28 x10E6/uL 4.16  4.09  4.24   Hemoglobin 11.1 - 15.9 g/dL 68.0  32.1  22.4   HCT 34.0 - 46.6 % 37.9  37.3  37.9   Platelets 150 - 450 x10E3/uL 252  284  271      There is no height or weight on file to calculate BMI.  Orders:  No orders of the defined types were placed in this encounter.  No orders of the defined types were placed in this encounter.    Procedures: No procedures performed  Clinical Data: No additional findings.  ROS:  All other systems negative, except as noted in the HPI. Review of Systems  Objective: Vital Signs: LMP 12/24/2020   Specialty Comments:  No specialty comments available.  PMFS History: Patient Active Problem List   Diagnosis Date Noted   Lumbar radiculopathy 06/03/2022   Perimenopause 08/27/2021   Tachycardia 08/27/2021   Class 1 obesity due to excess calories with serious comorbidity and body mass index (BMI) of 34.0 to 34.9 in adult 08/27/2021   Essential hypertension, benign 01/02/2019   Primary insomnia 01/02/2019   Past  Medical History:  Diagnosis Date   Allergic rhinitis    HTN (hypertension)    Obesity     Family History  Problem Relation Age of Onset   Diverticulitis Mother    Hypertension Mother    Diabetes Father     Past Surgical History:  Procedure Laterality Date   ECTOPIC PREGNANCY SURGERY  2003   Social History   Occupational History   Not on file  Tobacco Use   Smoking status: Former    Packs/day: 0.50    Years: 20.00    Total pack years: 10.00    Types: Cigarettes    Quit date: 03/12/2019    Years since quitting: 3.4   Smokeless tobacco: Never   Tobacco comments:    She quit in June  Vaping Use   Vaping Use: Never used  Substance and Sexual Activity   Alcohol use: Never   Drug use: Never   Sexual activity: Not on file

## 2022-08-24 ENCOUNTER — Telehealth: Payer: Self-pay | Admitting: Orthopedic Surgery

## 2022-08-24 NOTE — Telephone Encounter (Signed)
Waiting for paper work

## 2022-08-24 NOTE — Telephone Encounter (Signed)
Patient called advised [employer] is going to fax a Fit For Duty Form to Dr. Lajoyce Corners to complete and fax back. The fax# to return the form is [phone] (HR)  Second fax# [phone] Patient asked if the form can be faxed to both fax numbers.  The number to contact patient is [phone]

## 2022-08-25 NOTE — Telephone Encounter (Signed)
Received form. Will fill out and have Dr. Lajoyce Corners sign tomorrow when he gets back into office from surgery.

## 2022-08-26 NOTE — Telephone Encounter (Signed)
Form signed and faxed

## 2022-08-30 ENCOUNTER — Other Ambulatory Visit: Payer: Self-pay

## 2022-08-30 MED ORDER — TELMISARTAN-HCTZ 80-25 MG PO TABS: 1.0000 | ORAL_TABLET | Freq: Every day | ORAL | 2 refills | Status: DC

## 2022-09-06 ENCOUNTER — Other Ambulatory Visit: Payer: Self-pay | Admitting: Internal Medicine

## 2022-09-16 ENCOUNTER — Other Ambulatory Visit: Payer: Self-pay | Admitting: Internal Medicine

## 2022-09-20 ENCOUNTER — Ambulatory Visit (INDEPENDENT_AMBULATORY_CARE_PROVIDER_SITE_OTHER): Payer: BC Managed Care – PPO | Admitting: Orthopedic Surgery

## 2022-09-20 ENCOUNTER — Encounter: Payer: Self-pay | Admitting: Orthopedic Surgery

## 2022-09-20 DIAGNOSIS — M7662 Achilles tendinitis, left leg: Secondary | ICD-10-CM

## 2022-09-20 NOTE — Progress Notes (Signed)
Office Visit Note   Patient: Sara Short           Date of Birth: Jan 21, 1961           MRN: [id number] Visit Date: 09/20/2022              Requested by: Dorothyann Peng, MD [address] [place],  [place] [address] PCP: Dorothyann Peng, MD  Chief Complaint  Patient presents with   Left Leg - Follow-up      HPI: Patient is a 61 year old woman who presents in follow-up for nodular Achilles tendinitis on the left.  Patient is currently wearing a 916s and heel lift in a fracture boot.  She is working on stretching using a nitroglycerin patch she has been out of work for the last 4 weeks and she has undergone 2 shockwave therapy treatments with Dr. Shon Baton.  She states she feels like she is slowly getting better.  Assessment & Plan: Visit Diagnoses:  1. Achilles tendinitis, left leg     Plan: Patient is provided a prescription to remain out of work for 6 weeks.  She will continue with nitroglycerin patch daily changing location daily  Follow-Up Instructions: Return in about 4 weeks (around 10/18/2022).   Ortho Exam  Patient is alert, oriented, no adenopathy, well-dressed, normal affect, normal respiratory effort. Patient has less swelling over the Achilles she has improved function.  Patient states that she has had a flareup of the sciatic nerve and is using a heating pad.  Recommended continue with heat exercise and avoid sitting.  Imaging: No results found. No images are attached to the encounter.  Labs: Lab Results  Component Value Date   HGBA1C 5.7 (H) 06/03/2022   HGBA1C 5.8 (H) 12/08/2021   HGBA1C 5.6 09/24/2019     Lab Results  Component Value Date   ALBUMIN 4.3 06/03/2022   ALBUMIN 4.4 12/08/2021   ALBUMIN 3.9 06/01/2021    No results found for: "MG" Lab Results  Component Value Date   VD25OH 47.7 12/08/2021   VD25OH 46.8 12/01/2020   VD25OH 41.1 03/24/2020    No results found for: "PREALBUMIN"    Latest Ref Rng & Units 12/08/2021    11:53 AM 12/01/2020    3:54 PM 09/24/2019    3:57 PM  CBC EXTENDED  WBC 3.4 - 10.8 x10E3/uL 5.6  5.5  4.8   RBC 3.77 - 5.28 x10E6/uL 4.16  4.09  4.24   Hemoglobin 11.1 - 15.9 g/dL 70.0  17.4  94.4   HCT 34.0 - 46.6 % 37.9  37.3  37.9   Platelets 150 - 450 x10E3/uL 252  284  271      There is no height or weight on file to calculate BMI.  Orders:  No orders of the defined types were placed in this encounter.  No orders of the defined types were placed in this encounter.    Procedures: No procedures performed  Clinical Data: No additional findings.  ROS:  All other systems negative, except as noted in the HPI. Review of Systems  Objective: Vital Signs: LMP 12/24/2020   Specialty Comments:  No specialty comments available.  PMFS History: Patient Active Problem List   Diagnosis Date Noted   Lumbar radiculopathy 06/03/2022   Perimenopause 08/27/2021   Tachycardia 08/27/2021   Class 1 obesity due to excess calories with serious comorbidity and body mass index (BMI) of 34.0 to 34.9 in adult 08/27/2021   Essential hypertension, benign 01/02/2019   Primary  insomnia 01/02/2019   Past Medical History:  Diagnosis Date   Allergic rhinitis    HTN (hypertension)    Obesity     Family History  Problem Relation Age of Onset   Diverticulitis Mother    Hypertension Mother    Diabetes Father     Past Surgical History:  Procedure Laterality Date   ECTOPIC PREGNANCY SURGERY  2003   Social History   Occupational History   Not on file  Tobacco Use   Smoking status: Former    Packs/day: 0.50    Years: 20.00    Total pack years: 10.00    Types: Cigarettes    Quit date: 03/12/2019    Years since quitting: 3.5   Smokeless tobacco: Never   Tobacco comments:    She quit in June  Vaping Use   Vaping Use: Never used  Substance and Sexual Activity   Alcohol use: Never   Drug use: Never   Sexual activity: Not on file

## 2022-09-27 ENCOUNTER — Other Ambulatory Visit: Payer: Self-pay | Admitting: Sports Medicine

## 2022-09-27 ENCOUNTER — Other Ambulatory Visit: Payer: Self-pay | Admitting: Internal Medicine

## 2022-09-27 ENCOUNTER — Other Ambulatory Visit: Payer: Self-pay | Admitting: Family

## 2022-09-27 DIAGNOSIS — M7662 Achilles tendinitis, left leg: Secondary | ICD-10-CM

## 2022-10-07 ENCOUNTER — Ambulatory Visit: Payer: BC Managed Care – PPO

## 2022-10-27 ENCOUNTER — Other Ambulatory Visit: Payer: Self-pay | Admitting: Internal Medicine

## 2022-10-27 ENCOUNTER — Other Ambulatory Visit: Payer: Self-pay | Admitting: Orthopedic Surgery

## 2022-11-02 ENCOUNTER — Encounter: Payer: Self-pay | Admitting: Family

## 2022-11-02 ENCOUNTER — Ambulatory Visit (INDEPENDENT_AMBULATORY_CARE_PROVIDER_SITE_OTHER): Payer: BC Managed Care – PPO | Admitting: Family

## 2022-11-02 DIAGNOSIS — M7662 Achilles tendinitis, left leg: Secondary | ICD-10-CM | POA: Diagnosis not present

## 2022-11-02 NOTE — Progress Notes (Signed)
Office Visit Note   Patient: Sara Short           Date of Birth: 1961-10-10           MRN: [id number] Visit Date: 11/02/2022              Requested by: Glendale Chard, [address] [place],  [place] [address] PCP: Glendale Chard, MD  Chief Complaint  Patient presents with   Left Leg - Follow-up      HPI: The patient is a 62 year old woman who returns today for left Achilles tendinitis she has been in a cam walker with 1 heel lift using a nitroglycerin patch.  She complains of continued stiffness she does not feel ready to return to work.  She states that she is required to be on her feet and do stairs at work she would not be able to do this in her cam walker.  She has been ambulating some in the home with her regular shoewear and heel lift, but is much more comfortable in her cam walker  Visit to prior shockwave therapies with Dr. Rolena Infante which she feels were helpful  Assessment & Plan: Visit Diagnoses: No diagnosis found.  Plan: Given a note to return to work in 4 weeks.  Hope that she can get in with Dr. Rolena Infante in the next week or so for another shockwave therapy.  She declined physical therapy at this time. she will continue her heel lift  Follow-Up Instructions: Return in about 8 weeks (around 12/30/2022), or discuss RTW.   Ortho Exam  Patient is alert, oriented, no adenopathy, well-dressed, normal affect, normal respiratory effort. On examination of the left lower extremity she does have mild swelling over the Achilles.  She feels this is improved.  This continues to be tender.  Dorsiflexion to neutral.  No erythema or warmth.  Palpable dorsalis pedis pulse.  Imaging: No results found. No images are attached to the encounter.  Labs: Lab Results  Component Value Date   HGBA1C 5.7 (H) 06/03/2022   HGBA1C 5.8 (H) 12/08/2021   HGBA1C 5.6 09/24/2019     Lab Results  Component Value Date   ALBUMIN 4.3 06/03/2022   ALBUMIN 4.4 12/08/2021    ALBUMIN 3.9 06/01/2021    No results found for: "MG" Lab Results  Component Value Date   VD25OH 47.7 12/08/2021   VD25OH 46.8 12/01/2020   VD25OH 41.1 03/24/2020    No results found for: "PREALBUMIN"    Latest Ref Rng & Units 12/08/2021   11:53 AM 12/01/2020    3:54 PM 09/24/2019    3:57 PM  CBC EXTENDED  WBC 3.4 - 10.8 x10E3/uL 5.6  5.5  4.8   RBC 3.77 - 5.28 x10E6/uL 4.16  4.09  4.24   Hemoglobin 11.1 - 15.9 g/dL 12.9  12.3  12.9   HCT 34.0 - 46.6 % 37.9  37.3  37.9   Platelets 150 - 450 x10E3/uL 252  284  271      There is no height or weight on file to calculate BMI.  Orders:  No orders of the defined types were placed in this encounter.  No orders of the defined types were placed in this encounter.    Procedures: No procedures performed  Clinical Data: No additional findings.  ROS:  All other systems negative, except as noted in the HPI. Review of Systems  Objective: Vital Signs: LMP 12/24/2020   Specialty Comments:  No specialty comments available.  PMFS History: Patient Active Problem List   Diagnosis Date Noted   Lumbar radiculopathy 06/03/2022   Perimenopause 08/27/2021   Tachycardia 08/27/2021   Class 1 obesity due to excess calories with serious comorbidity and body mass index (BMI) of 34.0 to 34.9 in adult 08/27/2021   Essential hypertension, benign 01/02/2019   Primary insomnia 01/02/2019   Past Medical History:  Diagnosis Date   Allergic rhinitis    HTN (hypertension)    Obesity     Family History  Problem Relation Age of Onset   Diverticulitis Mother    Hypertension Mother    Diabetes Father     Past Surgical History:  Procedure Laterality Date   ECTOPIC PREGNANCY SURGERY  2003   Social History   Occupational History   Not on file  Tobacco Use   Smoking status: Former    Packs/day: 0.50    Years: 20.00    Total pack years: 10.00    Types: Cigarettes    Quit date: 03/12/2019    Years since quitting: 3.6   Smokeless  tobacco: Never   Tobacco comments:    She quit in June  Vaping Use   Vaping Use: Never used  Substance and Sexual Activity   Alcohol use: Never   Drug use: Never   Sexual activity: Not on file

## 2022-11-05 ENCOUNTER — Ambulatory Visit (INDEPENDENT_AMBULATORY_CARE_PROVIDER_SITE_OTHER): Payer: BC Managed Care – PPO | Admitting: Sports Medicine

## 2022-11-05 ENCOUNTER — Encounter: Payer: Self-pay | Admitting: Sports Medicine

## 2022-11-05 DIAGNOSIS — M7662 Achilles tendinitis, left leg: Secondary | ICD-10-CM

## 2022-11-05 NOTE — Patient Instructions (Signed)
Esty - so good seeing you again today. You are making progress with the achilles.  Things for you to do: - restart the nitroglycerin patch --> you will cut the patch into 1/4th's. Change this over affected area every 24 hours - begin stretching and rehab exercises. Perform these once a day, every day - work on weaning out of the boot and into regular tennis shoes with the heel lift  - I will see you back next week for another shockwave treatment  Dr. Rolena Infante

## 2022-11-05 NOTE — Progress Notes (Signed)
Sara Short - 62 y.o. female MRN [id number]  Date of birth: Aug 08, 1961  Office Visit Note: Visit Date: 11/05/2022 PCP: Glendale Chard, MD Referred by: Glendale Chard, MD  Subjective: Chief Complaint  Patient presents with   Left Heel - Pain, Follow-up   HPI: Sara Short is a pleasant 62 y.o. female who presents today for follow-up of left achilles tendinopathy.   She has found improvement since initiating shockwave therapy.  She has not been as consistent with the home exercises or stretching here recently, but is looking forward to starting back.  She had an aggravation after her last appointment was back in the boot for a period of time, but presents today in shoes with her heel lift.  She did begin placing the nitroglycerin patch back on the leg, but notes she had not been doing this for the past few months.  Was able to visit her mother in [place] over the holiday.  ECSWT #3 today  Pertinent ROS were reviewed with the patient and found to be negative unless otherwise specified above in HPI.   Assessment & Plan: Visit Diagnoses:  1. Achilles tendinitis, left leg    Plan: Discussed with Mathea the nature of her Achilles tendinopathy.  She did get some benefit from 2 prior shockwave treatment therapies, but has been a few months since we did this.  We did do a trial of this today, I would like her to continue weekly treatments for another 3-4 sessions to see if we can get her over the hump.  I did print out and demonstrate Achilles, gastroc stretching and rehab exercises for her to begin performing once daily.  Stressed the importance of this.  She will cut the nitroglycerin patch and force and placed this over the affected area every 24 hours.  Will follow-up in 1 week.  Follow-up: Return in about 1 week (around 11/12/2022) for for heel follow-up .   Meds & Orders: No orders of the defined types were placed in this encounter.  No orders of the defined types  were placed in this encounter.    Procedures: Procedure: ECSWT Indications:  Achilles tendinitis/tendinopathy   Procedure Details Consent: Risks of procedure as well as the alternatives and risks of each were explained to the patient.  Verbal consent for procedure obtained. Time Out: Verified patient identification, verified procedure, site was marked, verified correct patient position. The area was cleaned with alcohol swab.     The left achilles tendon was targeted for Extracorporeal shockwave therapy.    Preset: Patellar tendinitis Power Level: 110 mJ Frequency: 11 Hz Impulse/cycles: 2500 Head size: Regular   Patient tolerated procedure well without immediate complications.      Clinical History: No specialty comments available.  She reports that she quit smoking about 3 years ago. Her smoking use included cigarettes. She has a 10.00 pack-year smoking history. She has never used smokeless tobacco.  Recent Labs    12/08/21 1153 06/03/22 1527  HGBA1C 5.8* 5.7*    Objective:   Vital Signs: LMP 12/24/2020   Physical Exam  Gen: Well-appearing, in no acute distress; non-toxic CV: Regular Rate. Well-perfused. Warm.  Resp: Breathing unlabored on room air; no wheezing. Psych: Fluid speech in conversation; appropriate affect; normal thought process Neuro: Sensation intact throughout. No gross coordination deficits.   Ortho Exam - Left leg: There is some nodular thickening in the mid aspect of the Achilles, some TTP around this region.  No redness or warmth to the area.  Negative calcaneal squeeze test.  She has not intact Thompson test.  Dorsiflexion to about 85 degrees, full range plantarflexion.  Neurovascular intact distally.  Imaging: No results found.  Past Medical/Family/Surgical/Social History: Medications & Allergies reviewed per EMR, new medications updated. Patient Active Problem List   Diagnosis Date Noted   Lumbar radiculopathy 06/03/2022   Perimenopause  08/27/2021   Tachycardia 08/27/2021   Class 1 obesity due to excess calories with serious comorbidity and body mass index (BMI) of 34.0 to 34.9 in adult 08/27/2021   Essential hypertension, benign 01/02/2019   Primary insomnia 01/02/2019   Past Medical History:  Diagnosis Date   Allergic rhinitis    HTN (hypertension)    Obesity    Family History  Problem Relation Age of Onset   Diverticulitis Mother    Hypertension Mother    Diabetes Father    Past Surgical History:  Procedure Laterality Date   ECTOPIC PREGNANCY SURGERY  2003   Social History   Occupational History   Not on file  Tobacco Use   Smoking status: Former    Packs/day: 0.50    Years: 20.00    Total pack years: 10.00    Types: Cigarettes    Quit date: 03/12/2019    Years since quitting: 3.6   Smokeless tobacco: Never   Tobacco comments:    She quit in June  Vaping Use   Vaping Use: Never used  Substance and Sexual Activity   Alcohol use: Never   Drug use: Never   Sexual activity: Not on file

## 2022-11-09 LAB — HM COLONOSCOPY

## 2022-11-19 ENCOUNTER — Ambulatory Visit
Admission: RE | Admit: 2022-11-19 | Discharge: 2022-11-19 | Disposition: A | Discharge status: Home / Self Care | Payer: BC Managed Care – PPO | Source: Ambulatory Visit | Attending: Internal Medicine | Admitting: Internal Medicine

## 2022-11-19 DIAGNOSIS — Z1231 Encounter for screening mammogram for malignant neoplasm of breast: Secondary | ICD-10-CM

## 2022-11-22 ENCOUNTER — Ambulatory Visit (INDEPENDENT_AMBULATORY_CARE_PROVIDER_SITE_OTHER): Payer: BC Managed Care – PPO | Admitting: Sports Medicine

## 2022-11-22 ENCOUNTER — Encounter: Payer: Self-pay | Admitting: Sports Medicine

## 2022-11-22 DIAGNOSIS — M7662 Achilles tendinitis, left leg: Secondary | ICD-10-CM

## 2022-11-22 NOTE — Progress Notes (Signed)
Doing ok; first treatment went well she thinks She did have some pain in the right foot from putting more weight on it, but it seems to be doing better

## 2022-11-22 NOTE — Progress Notes (Signed)
Sara Short - 62 y.o. female MRN [id number]  Date of birth: 1961/03/28  Office Visit Note: Visit Date: 11/22/2022 PCP: Glendale Chard, MD Referred by: Glendale Chard, MD  Subjective: Chief Complaint  Patient presents with   Left Heel - Follow-up   HPI: Sara Short is a pleasant 62 y.o. female who presents today for follow-up of chronic left achilles tendinopathy.   She continues to have slow improvement with the Achilles.  Did tolerate the last treatment well.  She continues in the heel lift in both the shoes.  She has been using the nitroglycerin patch as well, changing once daily.  She is also performing the stretching and home rehab exercises we had given her.  Overall feels like she is about 80% improved, she is considering when she feels like she will be ready to return back to work.  ECSWT #4 today   Pertinent ROS were reviewed with the patient and found to be negative unless otherwise specified above in HPI.   Assessment & Plan: Visit Diagnoses:  1. Achilles tendinitis, left leg    Plan: Discussed with Raashida that she is making improvement for her Achilles with the shockwave treatment, nitroglycerin patch, as well as home rehab.  I would like her to stay diligent with these home exercises as well as stay in the heel lifts.  Through shared decision-making, did elect to continue with shockwave therapy, I do think we should plan for about 2 more of these treatments 1 week apart and then reevaluate if she is back to her baseline and ready to return to activity.  She is following up with Dr. Sharol Given and Hassel Neth for this, may have her see them after next week's appointment to help make this decision.  She will follow-up with me next week for repeat treatment and assessment.  Follow-up: Return in about 1 week (around 11/29/2022) for for ECSWT.   Meds & Orders: No orders of the defined types were placed in this encounter.  No orders of the defined types were placed  in this encounter.    Procedures: Procedure: ECSWT Indications:  Achilles tendinitis/tendinopathy   Procedure Details Consent: Risks of procedure as well as the alternatives and risks of each were explained to the patient.  Verbal consent for procedure obtained. Time Out: Verified patient identification, verified procedure, site was marked, verified correct patient position. The area was cleaned with alcohol swab.     The left achilles tendon was targeted for Extracorporeal shockwave therapy.    Preset: Patellar tendinitis Power Level: 120 mJ Frequency: 12 Hz Impulse/cycles: 3000 Head size: Regular   Patient tolerated procedure well without immediate complications.      Clinical History: No specialty comments available.  She reports that she quit smoking about 3 years ago. Her smoking use included cigarettes. She has a 10.00 pack-year smoking history. She has never used smokeless tobacco.  Recent Labs    12/08/21 1153 06/03/22 1527  HGBA1C 5.8* 5.7*    Objective:   Vital Signs: LMP 12/24/2020   Physical Exam  Gen: Well-appearing, in no acute distress; non-toxic CV: Well-perfused. Warm.  Resp: Breathing unlabored on room air; no wheezing. Psych: Fluid speech in conversation; appropriate affect; normal thought process Neuro: Sensation intact throughout. No gross coordination deficits.   Ortho Exam -Left leg: Mild residual thickening of the mid aspect of the Achilles, improved tenderness to palpation.  There is no redness or surrounding swelling to the area.  Negative calcaneal heel squeeze.  Negative  Thompson's test with intact plantarflexion.  Neurovascular intact distally.  Imaging: No results found.  Past Medical/Family/Surgical/Social History: Medications & Allergies reviewed per EMR, new medications updated. Patient Active Problem List   Diagnosis Date Noted   Lumbar radiculopathy 06/03/2022   Perimenopause 08/27/2021   Tachycardia 08/27/2021   Class 1  obesity due to excess calories with serious comorbidity and body mass index (BMI) of 34.0 to 34.9 in adult 08/27/2021   Essential hypertension, benign 01/02/2019   Primary insomnia 01/02/2019   Past Medical History:  Diagnosis Date   Allergic rhinitis    HTN (hypertension)    Obesity    Family History  Problem Relation Age of Onset   Diverticulitis Mother    Hypertension Mother    Diabetes Father    Past Surgical History:  Procedure Laterality Date   ECTOPIC PREGNANCY SURGERY  2003   Social History   Occupational History   Not on file  Tobacco Use   Smoking status: Former    Packs/day: 0.50    Years: 20.00    Total pack years: 10.00    Types: Cigarettes    Quit date: 03/12/2019    Years since quitting: 3.7   Smokeless tobacco: Never   Tobacco comments:    She quit in June  Vaping Use   Vaping Use: Never used  Substance and Sexual Activity   Alcohol use: Never   Drug use: Never   Sexual activity: Not on file

## 2022-11-26 ENCOUNTER — Other Ambulatory Visit: Payer: Self-pay | Admitting: Family

## 2022-11-30 ENCOUNTER — Ambulatory Visit (INDEPENDENT_AMBULATORY_CARE_PROVIDER_SITE_OTHER): Payer: BC Managed Care – PPO | Admitting: Sports Medicine

## 2022-11-30 ENCOUNTER — Telehealth: Payer: Self-pay | Admitting: Family

## 2022-11-30 ENCOUNTER — Encounter: Payer: Self-pay | Admitting: Sports Medicine

## 2022-11-30 DIAGNOSIS — M7662 Achilles tendinitis, left leg: Secondary | ICD-10-CM | POA: Diagnosis not present

## 2022-11-30 NOTE — Telephone Encounter (Signed)
Pt was evaluated in the office today by Dr. Rolena Infante and letter was given to remain out of work until her follow up apt with Dr. Sharol Given 12/14/2022. Called and advised pt we will need to see her at her visit in 2 weeks and Dr. Sharol Given can advise after evaluation if she can return to work and what the restrictions will be if needed. Pt voiced understanding and will call with any questions.

## 2022-11-30 NOTE — Progress Notes (Signed)
Sara Short - 62 y.o. female MRN [id number]  Date of birth: 05/07/1961  Office Visit Note: Visit Date: 11/30/2022 PCP: Glendale Chard, MD Referred by: Glendale Chard, MD  Subjective: Chief Complaint  Patient presents with   Left Leg - Follow-up   HPI: Sara Short is a very pleasant 62 y.o. female who presents today for chronic left Achilles tendinopathy.  Jassica continues to have good improvement with the extracorporeal shockwave therapy, home exercises and stretching, heel lifts. Also using nitroglycerin patch protocol. At this point, she feels like she is about 85-90% improved from our initial visit.  She has not yet returned to work. She was able to go to [facility] last week and walk around with no pain--she was excited about this. She does have some hesitancy to return to work that it may flare back up her symptoms.  Dr. Sharol Given and team did write her out for work for a period of time, she would like to follow-up with them to discuss return to work plan.  ECSWT #5 today   Pertinent ROS were reviewed with the patient and found to be negative unless otherwise specified above in HPI.   Assessment & Plan: Visit Diagnoses:  1. Achilles tendinitis, left leg    Plan: Discussed with Adeeba she has made vast improvement with her Achilles tendinopathy with the above treatment modalities.  She has now completed her fifth session of ECSWT. As she nears feeling almost back to 100%, I would like her to follow back up with Dr. Sharol Given to discuss her return to work.  From my perspective, she has no contraindication on returning.  We did discuss that with prolonged walking it is always possible this may possibly flare back up. After her appointment with Dr. Sharol Given, if she wishes to continue an additional 1-2 shockwave treatments, we may proceed with this at his discretion.  Important to continue home stretching and rehab for treatment and prevention.  Follow-up: Return for with Dr. Sharol Given for  L-achilles tendinitis (discuss return to work).   Meds & Orders: No orders of the defined types were placed in this encounter.  No orders of the defined types were placed in this encounter.    Procedures: Procedure: ECSWT Indications:  Achilles tendinitis/tendinopathy   Procedure Details Consent: Risks of procedure as well as the alternatives and risks of each were explained to the patient.  Verbal consent for procedure obtained. Time Out: Verified patient identification, verified procedure, site was marked, verified correct patient position. The area was cleaned with alcohol swab.     The left achilles tendon was targeted for Extracorporeal shockwave therapy.    Preset: Achilles/Patellar tendinitis Power Level: 120 mJ Frequency: 12 Hz Impulse/cycles: 3200 Head size: Regular   Patient tolerated procedure well without immediate complications.       Clinical History: No specialty comments available.  She reports that she quit smoking about 3 years ago. Her smoking use included cigarettes. She has a 10.00 pack-year smoking history. She has never used smokeless tobacco.  Recent Labs    12/08/21 1153 06/03/22 1527  HGBA1C 5.8* 5.7*    Objective:   Vital Signs: LMP 12/24/2020   Physical Exam  Gen: Well-appearing, in no acute distress; non-toxic CV: Well-perfused. Warm.  Resp: Breathing unlabored on room air; no wheezing. Psych: Fluid speech in conversation; appropriate affect; normal thought process Neuro: Sensation intact throughout. No gross coordination deficits.   Ortho Exam - Left leg: Very mild residual thickening in the mid aspect of  the Achilles, no significant tenderness to palpation today.  There is no overlying redness, swelling or soft tissue swelling.  Negative calcaneal heel squeeze.  Neurovascularly intact distally.  Imaging: No results found.  Past Medical/Family/Surgical/Social History: Medications & Allergies reviewed per EMR, new medications  updated. Patient Active Problem List   Diagnosis Date Noted   Lumbar radiculopathy 06/03/2022   Perimenopause 08/27/2021   Tachycardia 08/27/2021   Class 1 obesity due to excess calories with serious comorbidity and body mass index (BMI) of 34.0 to 34.9 in adult 08/27/2021   Essential hypertension, benign 01/02/2019   Primary insomnia 01/02/2019   Past Medical History:  Diagnosis Date   Allergic rhinitis    HTN (hypertension)    Obesity    Family History  Problem Relation Age of Onset   Diverticulitis Mother    Hypertension Mother    Diabetes Father    Past Surgical History:  Procedure Laterality Date   ECTOPIC PREGNANCY SURGERY  2003   Social History   Occupational History   Not on file  Tobacco Use   Smoking status: Former    Packs/day: 0.50    Years: 20.00    Total pack years: 10.00    Types: Cigarettes    Quit date: 03/12/2019    Years since quitting: 3.7   Smokeless tobacco: Never   Tobacco comments:    She quit in June  Vaping Use   Vaping Use: Never used  Substance and Sexual Activity   Alcohol use: Never   Drug use: Never   Sexual activity: Not on file

## 2022-11-30 NOTE — Telephone Encounter (Signed)
Patient called asked if she can get a note stating that patient can return to work with restrictions after her appointment with Dr. Sharol Given. The  number to contact patient is [phone]

## 2022-12-06 ENCOUNTER — Telehealth: Payer: Self-pay | Admitting: Orthopedic Surgery

## 2022-12-06 NOTE — Telephone Encounter (Signed)
Received a call from patient. Stated her employer has not received her disability forms. I advised [employer] faxed on 11/30/22. I verified the fax number and re-faxed forms. Fax [phone]

## 2022-12-06 NOTE — Telephone Encounter (Signed)
Patient called and gave new fax number for her forms to be sent to. She stated the previous fax number was incorrect. Her employer gave her [phone]. 11/30/22 703 Forms re-faxed to this number per patients request.

## 2022-12-14 ENCOUNTER — Ambulatory Visit: Payer: BC Managed Care – PPO | Admitting: Orthopedic Surgery

## 2022-12-14 ENCOUNTER — Encounter: Payer: Self-pay | Admitting: Internal Medicine

## 2022-12-14 ENCOUNTER — Ambulatory Visit (INDEPENDENT_AMBULATORY_CARE_PROVIDER_SITE_OTHER): Payer: BC Managed Care – PPO | Admitting: Internal Medicine

## 2022-12-14 VITALS — BP 116/78 | HR 86 | Temp 98.6°F | Ht 67.0 in | Wt 251.6 lb

## 2022-12-14 DIAGNOSIS — M7662 Achilles tendinitis, left leg: Secondary | ICD-10-CM

## 2022-12-14 DIAGNOSIS — Z6839 Body mass index (BMI) 39.0-39.9, adult: Secondary | ICD-10-CM

## 2022-12-14 DIAGNOSIS — I10 Essential (primary) hypertension: Secondary | ICD-10-CM

## 2022-12-14 DIAGNOSIS — E66812 Obesity, class 2: Secondary | ICD-10-CM

## 2022-12-14 DIAGNOSIS — Z2821 Immunization not carried out because of patient refusal: Secondary | ICD-10-CM

## 2022-12-14 DIAGNOSIS — R7309 Other abnormal glucose: Secondary | ICD-10-CM

## 2022-12-14 DIAGNOSIS — Z0000 Encounter for general adult medical examination without abnormal findings: Secondary | ICD-10-CM | POA: Diagnosis not present

## 2022-12-14 DIAGNOSIS — E559 Vitamin D deficiency, unspecified: Secondary | ICD-10-CM

## 2022-12-14 LAB — POCT URINALYSIS DIPSTICK
Bilirubin, UA: NEGATIVE
Blood, UA: NEGATIVE
Glucose, UA: NEGATIVE
Ketones, UA: NEGATIVE
Leukocytes, UA: NEGATIVE
Nitrite, UA: NEGATIVE
Protein, UA: NEGATIVE
Spec Grav, UA: >=1.03 — AB (ref 1.010–1.025)
Urobilinogen, UA: 0.2 E.U./dL
pH, UA: 7 (ref 5.0–8.0)

## 2022-12-14 NOTE — Patient Instructions (Signed)

## 2022-12-14 NOTE — Progress Notes (Unsigned)
I,Sara Short,acting as a scribe for Sara Greenland, MD.,have documented all relevant documentation on the behalf of Sara Greenland, MD,as directed by  Sara Greenland, MD while in the presence of Sara Greenland, MD.   Subjective:     Patient ID: Sara Short , female    DOB: 12/04/1960 , 62 y.o.   MRN: [id number]   Chief Complaint  Patient presents with   Annual Exam   Hypertension    HPI  She is here today for a full physical exam. She is not followed by GYN. She completes PAP here at the office. She reports compliance with meds. She denies headaches, chest pain and shortness of breath.   Hypertension This is a chronic problem. The current episode started more than 1 year ago. The problem has been gradually improving since onset. The problem is controlled. Pertinent negatives include no blurred vision, chest pain, palpitations or shortness of breath. The current treatment provides moderate improvement. Compliance problems include exercise.      Past Medical History:  Diagnosis Date   Allergic rhinitis    HTN (hypertension)    Obesity      Family History  Problem Relation Age of Onset   Diverticulitis Mother    Hypertension Mother    Diabetes Father      Current Outpatient Medications:    amLODipine (NORVASC) 2.5 MG tablet, TAKE 1 TABLET(2.5 MG) BY MOUTH AT BEDTIME, Disp: 30 tablet, Rfl: 2   Chlorpheniramine Maleate (ALLERGY PO), Take by mouth., Disp: , Rfl:    Cholecalciferol (VITAMIN D3) 25 MCG (1000 UT) CAPS, Take 2 capsules (2,000 Units total) by mouth daily. 1 per day, Disp: 60 capsule, Rfl: 1   Magnesium 400 MG CAPS, Take 400 mg by mouth Nightly., Disp: 90 capsule, Rfl: 1   Multiple Vitamin (MULTIVITAMIN) capsule, Take 1 capsule by mouth daily., Disp: , Rfl:    nitroGLYCERIN (NITRODUR - DOSED IN MG/24 HR) 0.2 mg/hr patch, Place 1/4 patch on affected area of achilles., Disp: 30 patch, Rfl: 1   nystatin cream (MYCOSTATIN), APPLY TOPICALLY TO THE  AFFECTED AREA TWICE DAILY, Disp: 30 g, Rfl: 0   predniSONE (DELTASONE) 10 MG tablet, TAKE 1 TABLET(10 MG) BY MOUTH DAILY WITH BREAKFAST, Disp: 30 tablet, Rfl: 0   simvastatin (ZOCOR) 20 MG tablet, TAKE 1 TABLET(20 MG) BY MOUTH AT BEDTIME, Disp: 90 tablet, Rfl: 2   telmisartan-hydrochlorothiazide (MICARDIS HCT) 80-25 MG tablet, Take 1 tablet by mouth daily., Disp: 90 tablet, Rfl: 2   tiZANidine (ZANAFLEX) 4 MG tablet, TAKE 1 TABLET(4 MG) BY MOUTH DAILY AS NEEDED FOR MUSCLE SPASMS, Disp: 30 tablet, Rfl: 0   nystatin (NYSTATIN) powder, Apply 1 application topically 3 (three) times daily. (Patient not taking: Reported on 12/08/2021), Disp: 60 g, Rfl: 0   Allergies  Allergen Reactions   Crestor [Rosuvastatin Calcium] Hives   Lipitor [Atorvastatin Calcium] Hives      The patient states she uses {contraceptive methods:5051} for birth control. Last LMP was Patient's last menstrual period was 12/24/2020.Marland Kitchen {Dysmenorrhea-menorrhagia:21918}. Negative for: breast discharge, breast lump(s), breast pain and breast self exam. Associated symptoms include abnormal vaginal bleeding. Pertinent negatives include abnormal bleeding (hematology), anxiety, decreased libido, depression, difficulty falling sleep, dyspareunia, history of infertility, nocturia, sexual dysfunction, sleep disturbances, urinary incontinence, urinary urgency, vaginal discharge and vaginal itching. Diet regular.The patient states her exercise level is    . The patient's tobacco use is:  Social History   Tobacco Use  Smoking Status Former  Packs/day: 0.50   Years: 20.00   Total pack years: 10.00   Types: Cigarettes   Quit date: 03/12/2019   Years since quitting: 3.7  Smokeless Tobacco Never  Tobacco Comments   She quit in June  . She has been exposed to passive smoke. The patient's alcohol use is:  Social History   Substance and Sexual Activity  Alcohol Use Never  . Additional information: Last pap ***, next one scheduled for ***.     Review of Systems  Constitutional: Negative.   HENT: Negative.    Eyes: Negative.  Negative for blurred vision.  Respiratory: Negative.  Negative for shortness of breath.   Cardiovascular: Negative.  Negative for chest pain and palpitations.  Gastrointestinal: Negative.   Endocrine: Negative.   Genitourinary: Negative.   Musculoskeletal: Negative.   Skin: Negative.   Allergic/Immunologic: Negative.   Neurological: Negative.   Hematological: Negative.   Psychiatric/Behavioral: Negative.       Today's Vitals   12/14/22 1054  BP: 116/78  Pulse: 86  Temp: 98.6 F (37 C)  SpO2: 97%  Weight: 251 lb 9.6 oz (114.1 kg)  Height: '5\' 7"'$  (1.702 m)   Body mass index is 39.41 kg/m.  Wt Readings from Last 3 Encounters:  12/14/22 251 lb 9.6 oz (114.1 kg)  06/03/22 235 lb 6.4 oz (106.8 kg)  12/08/21 226 lb 6.4 oz (102.7 kg)    Objective:  Physical Exam      Assessment And Plan:     1. Encounter for general adult medical examination w/o abnormal findings  2. Essential hypertension, benign - POCT Urinalysis Dipstick (81002) - Microalbumin / creatinine urine ratio - EKG 12-Lead  3. Other abnormal glucose  4. Vitamin D deficiency disease  5. Influenza vaccination declined  6. Herpes zoster vaccination declined  7. Class 2 severe obesity due to excess calories with serious comorbidity and body mass index (BMI) of 39.0 to 39.9 in adult [facility]) She is encouraged to strive for BMI less than 30 to decrease cardiac risk. Advised to aim for at least 150 minutes of exercise per week.     Patient was given opportunity to ask questions. Patient verbalized understanding of the plan and was able to repeat key elements of the plan. All questions were answered to their satisfaction.   Sara Greenland, MD   I, Sara Greenland, MD, have reviewed all documentation for this visit. The documentation on 12/14/22 for the exam, diagnosis, procedures, and orders are all accurate and complete.    THE PATIENT IS ENCOURAGED TO PRACTICE SOCIAL DISTANCING DUE TO THE COVID-19 PANDEMIC.

## 2022-12-15 ENCOUNTER — Encounter: Payer: Self-pay | Admitting: Family

## 2022-12-15 ENCOUNTER — Ambulatory Visit (INDEPENDENT_AMBULATORY_CARE_PROVIDER_SITE_OTHER): Payer: BC Managed Care – PPO | Admitting: Family

## 2022-12-15 DIAGNOSIS — M7662 Achilles tendinitis, left leg: Secondary | ICD-10-CM

## 2022-12-15 DIAGNOSIS — M542 Cervicalgia: Secondary | ICD-10-CM

## 2022-12-15 LAB — LIPID PANEL
Chol/HDL Ratio: 4.9 ratio — ABNORMAL HIGH (ref 0.0–4.4)
Cholesterol, Total: 204 mg/dL — ABNORMAL HIGH (ref 100–199)
HDL: 42 mg/dL
LDL Chol Calc (NIH): 123 mg/dL — ABNORMAL HIGH (ref 0–99)
Triglycerides: 219 mg/dL — ABNORMAL HIGH (ref 0–149)
VLDL Cholesterol Cal: 39 mg/dL (ref 5–40)

## 2022-12-15 LAB — CMP14+EGFR
ALT: 30 IU/L (ref 0–32)
AST: 28 IU/L (ref 0–40)
Albumin/Globulin Ratio: 1.4 (ref 1.2–2.2)
Albumin: 4.4 g/dL (ref 3.8–4.9)
Alkaline Phosphatase: 79 IU/L (ref 44–121)
BUN/Creatinine Ratio: 17 (ref 12–28)
BUN: 12 mg/dL (ref 8–27)
Bilirubin Total: 0.2 mg/dL (ref 0.0–1.2)
CO2: 26 mmol/L (ref 20–29)
Calcium: 10.2 mg/dL (ref 8.7–10.3)
Chloride: 103 mmol/L (ref 96–106)
Creatinine, Ser: 0.72 mg/dL (ref 0.57–1.00)
Globulin, Total: 3.2 g/dL (ref 1.5–4.5)
Glucose: 87 mg/dL (ref 70–99)
Potassium: 4.3 mmol/L (ref 3.5–5.2)
Sodium: 143 mmol/L (ref 134–144)
Total Protein: 7.6 g/dL (ref 6.0–8.5)
eGFR: 96 mL/min/{1.73_m2} (ref >59)

## 2022-12-15 LAB — CBC
Hematocrit: 41.6 % (ref 34.0–46.6)
Hemoglobin: 13.5 g/dL (ref 11.1–15.9)
MCH: 29.7 pg (ref 26.6–33.0)
MCHC: 32.5 g/dL (ref 31.5–35.7)
MCV: 92 fL (ref 79–97)
Platelets: 232 10*3/uL (ref 150–450)
RBC: 4.54 x10E6/uL (ref 3.77–5.28)
RDW: 13.7 % (ref 11.7–15.4)
WBC: 6.6 10*3/uL (ref 3.4–10.8)

## 2022-12-15 LAB — HEMOGLOBIN A1C
Est. average glucose Bld gHb Est-mCnc: 120 mg/dL
Hgb A1c MFr Bld: 5.8 % — ABNORMAL HIGH (ref 4.8–5.6)

## 2022-12-15 LAB — MICROALBUMIN / CREATININE URINE RATIO
Creatinine, Urine: 123.6 mg/dL
Microalb/Creat Ratio: 7 mg/g creat (ref 0–29)
Microalbumin, Urine: 8.7 ug/mL

## 2022-12-15 MED ORDER — METHOCARBAMOL 500 MG PO TABS: 500.0000 mg | ORAL_TABLET | Freq: Four times a day (QID) | ORAL | 0 refills | Status: AC | PRN
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
## 2022-12-25 ENCOUNTER — Other Ambulatory Visit: Payer: Self-pay | Admitting: Internal Medicine

## 2023-01-18 ENCOUNTER — Ambulatory Visit: Payer: BC Managed Care – PPO | Admitting: Orthopedic Surgery

## 2023-01-18 DIAGNOSIS — M7662 Achilles tendinitis, left leg: Secondary | ICD-10-CM

## 2023-01-18 MED ORDER — PREDNISONE 10 MG PO TABS: 10.0000 mg | ORAL_TABLET | Freq: Every day | ORAL | 0 refills | Status: DC

## 2023-01-30 ENCOUNTER — Encounter: Payer: Self-pay | Admitting: Orthopedic Surgery

## 2023-01-30 NOTE — Progress Notes (Signed)
Office Visit Note   Patient: Sara Short           Date of Birth: 1960/10/18           MRN: [id number] Visit Date: 01/18/2023              Requested by: Dorothyann Peng, MD [address] [place],  [place] [address] PCP: Dorothyann Peng, MD  Chief Complaint  Patient presents with   Left Achilles Tendon - Follow-up      HPI: Patient is a 62 year old woman who is seen in follow-up for left Achilles tendinitis.  Patient has had sessions with shockwave therapy that have resolved her symptoms.  She is using heel lifts.  She states she is doing well.  Assessment & Plan: Visit Diagnoses:  1. Achilles tendinitis, left leg     Plan: Patient is provided a note to return to work on April 30.  A prescription for prednisone was provided.  Follow-Up Instructions: No follow-ups on file.   Ortho Exam  Patient is alert, oriented, no adenopathy, well-dressed, normal affect, normal respiratory effort. Examination patient has a negative straight leg raise.  She complains of left calf cramping with walking.  Discussed that she can try electrolyte drinks to see if this would help.  She also has some left buttocks radicular pain radiating to the posterior aspect of the left thigh.  Patient has had good results with prednisone in the past.  There is no focal motor weakness in either lower extremity.  Her Achilles tendon is nontender.  Imaging: No results found. No images are attached to the encounter.  Labs: Lab Results  Component Value Date   HGBA1C 5.8 (H) 12/14/2022   HGBA1C 5.7 (H) 06/03/2022   HGBA1C 5.8 (H) 12/08/2021     Lab Results  Component Value Date   ALBUMIN 4.4 12/14/2022   ALBUMIN 4.3 06/03/2022   ALBUMIN 4.4 12/08/2021    No results found for: "MG" Lab Results  Component Value Date   VD25OH 47.7 12/08/2021   VD25OH 46.8 12/01/2020   VD25OH 41.1 03/24/2020    No results found for: "PREALBUMIN"    Latest Ref Rng & Units 12/14/2022   12:49 PM  12/08/2021   11:53 AM 12/01/2020    3:54 PM  CBC EXTENDED  WBC 3.4 - 10.8 x10E3/uL 6.6  5.6  5.5   RBC 3.77 - 5.28 x10E6/uL 4.54  4.16  4.09   Hemoglobin 11.1 - 15.9 g/dL 19.1  47.8  29.5   HCT 34.0 - 46.6 % 41.6  37.9  37.3   Platelets 150 - 450 x10E3/uL 232  252  284      There is no height or weight on file to calculate BMI.  Orders:  No orders of the defined types were placed in this encounter.  Meds ordered this encounter  Medications   predniSONE (DELTASONE) 10 MG tablet    Sig: Take 1 tablet (10 mg total) by mouth daily with breakfast.    Dispense:  30 tablet    Refill:  0     Procedures: No procedures performed  Clinical Data: No additional findings.  ROS:  All other systems negative, except as noted in the HPI. Review of Systems  Objective: Vital Signs: LMP 12/24/2020   Specialty Comments:  No specialty comments available.  PMFS History: Patient Active Problem List   Diagnosis Date Noted   Class 2 severe obesity due to excess calories with serious comorbidity and body mass index (  BMI) of 39.0 to 39.9 in adult 12/15/2022   Achilles tendinitis of left lower extremity 12/15/2022   Lumbar radiculopathy 06/03/2022   Perimenopause 08/27/2021   Tachycardia 08/27/2021   Class 1 obesity due to excess calories with serious comorbidity and body mass index (BMI) of 34.0 to 34.9 in adult 08/27/2021   Essential hypertension, benign 01/02/2019   Primary insomnia 01/02/2019   Past Medical History:  Diagnosis Date   Allergic rhinitis    HTN (hypertension)    Obesity     Family History  Problem Relation Age of Onset   Diverticulitis Mother    Hypertension Mother    Diabetes Father     Past Surgical History:  Procedure Laterality Date   ECTOPIC PREGNANCY SURGERY  2003   Social History   Occupational History   Not on file  Tobacco Use   Smoking status: Former    Packs/day: 0.50    Years: 20.00    Additional pack years: 0.00    Total pack years: 10.00     Types: Cigarettes    Quit date: 03/12/2019    Years since quitting: 3.8   Smokeless tobacco: Never   Tobacco comments:    She quit in June  Vaping Use   Vaping Use: Never used  Substance and Sexual Activity   Alcohol use: Never   Drug use: Never   Sexual activity: Not on file

## 2023-01-31 ENCOUNTER — Telehealth: Payer: Self-pay | Admitting: Sports Medicine

## 2023-01-31 ENCOUNTER — Other Ambulatory Visit: Payer: Self-pay | Admitting: Sports Medicine

## 2023-01-31 DIAGNOSIS — M7662 Achilles tendinitis, left leg: Secondary | ICD-10-CM

## 2023-01-31 MED ORDER — NITROGLYCERIN 0.2 MG/HR TD PT24: MEDICATED_PATCH | TRANSDERMAL | 0 refills | Status: DC

## 2023-01-31 NOTE — Telephone Encounter (Signed)
Patient asking for a refill on her Nitro patches. Please advise. Patient states Dr. Shon Baton is the on that Rx the medication

## 2023-02-11 ENCOUNTER — Telehealth: Payer: Self-pay | Admitting: Orthopedic Surgery

## 2023-02-11 NOTE — Telephone Encounter (Signed)
Patient states her employer has faxed a form for patients work status. Please advise when done

## 2023-02-15 NOTE — Telephone Encounter (Signed)
RTW without restrictions

## 2023-02-16 ENCOUNTER — Telehealth: Payer: Self-pay | Admitting: Radiology

## 2023-02-16 NOTE — Telephone Encounter (Signed)
Form completed and being signed by Denny Peon. Will fax.

## 2023-02-16 NOTE — Telephone Encounter (Signed)
Patient called asking about her two forms, assistant in clinic had one form- [employer] For Duty, did not receive a Short Term Disability form.  Patient will have her employer refax it to my attention, and I will make sure gets filled out for her.

## 2023-02-24 ENCOUNTER — Other Ambulatory Visit: Payer: Self-pay | Admitting: Orthopedic Surgery

## 2023-02-24 ENCOUNTER — Telehealth: Payer: Self-pay | Admitting: Orthopedic Surgery

## 2023-02-24 NOTE — Telephone Encounter (Signed)
[employer] form received. To [employer].

## 2023-03-26 ENCOUNTER — Other Ambulatory Visit: Payer: Self-pay | Admitting: Internal Medicine

## 2023-03-26 ENCOUNTER — Other Ambulatory Visit: Payer: Self-pay | Admitting: Orthopedic Surgery

## 2023-04-06 ENCOUNTER — Telehealth: Payer: Self-pay | Admitting: Orthopedic Surgery

## 2023-04-06 NOTE — Telephone Encounter (Signed)
I called pt and advised that we could not provide jury duty excuse.advised reserved for post op pt's for a short time after surgery and she was doing well at last visit and is not a surgical pt.  Voiced understanding and will call with any questions.

## 2023-04-06 NOTE — Telephone Encounter (Signed)
Pt called requesting a letter for [facility] form [place] Denny Peon or Dr Lajoyce Corners excusing pt from jury duty. Pt states she is unable to sit for long periods and need as soon as possible. Please fax to [phone].

## 2023-04-25 ENCOUNTER — Other Ambulatory Visit: Payer: Self-pay | Admitting: Orthopedic Surgery

## 2023-05-25 ENCOUNTER — Other Ambulatory Visit: Payer: Self-pay | Admitting: Orthopedic Surgery

## 2023-06-08 NOTE — Patient Instructions (Signed)
Hypertension, Adult High blood pressure (hypertension) is when the force of blood pumping through the arteries is too strong. The arteries are the blood vessels that carry blood from the heart throughout the body. Hypertension forces the heart to work harder to pump blood and may cause arteries to become narrow or stiff. Untreated or uncontrolled hypertension can lead to a heart attack, heart failure, a stroke, kidney disease, and other problems. A blood pressure reading consists of a higher number over a lower number. Ideally, your blood pressure should be below 120/80. The first ("top") number is called the systolic pressure. It is a measure of the pressure in your arteries as your heart beats. The second ("bottom") number is called the diastolic pressure. It is a measure of the pressure in your arteries as the heart relaxes. What are the causes? The exact cause of this condition is not known. There are some conditions that result in high blood pressure. What increases the risk? Certain factors may make you more likely to develop high blood pressure. Some of these risk factors are under your control, including: Smoking. Not getting enough exercise or physical activity. Being overweight. Having too much fat, sugar, calories, or salt (sodium) in your diet. Drinking too much alcohol. Other risk factors include: Having a personal history of heart disease, diabetes, high cholesterol, or kidney disease. Stress. Having a family history of high blood pressure and high cholesterol. Having obstructive sleep apnea. Age. The risk increases with age. What are the signs or symptoms? High blood pressure may not cause symptoms. Very high blood pressure (hypertensive crisis) may cause: Headache. Fast or irregular heartbeats (palpitations). Shortness of breath. Nosebleed. Nausea and vomiting. Vision changes. Severe chest pain, dizziness, and seizures. How is this diagnosed? This condition is diagnosed by  measuring your blood pressure while you are seated, with your arm resting on a flat surface, your legs uncrossed, and your feet flat on the floor. The cuff of the blood pressure monitor will be placed directly against the skin of your upper arm at the level of your heart. Blood pressure should be measured at least twice using the same arm. Certain conditions can cause a difference in blood pressure between your right and left arms. If you have a high blood pressure reading during one visit or you have normal blood pressure with other risk factors, you may be asked to: Return on a different day to have your blood pressure checked again. Monitor your blood pressure at home for 1 week or longer. If you are diagnosed with hypertension, you may have other blood or imaging tests to help your health care provider understand your overall risk for other conditions. How is this treated? This condition is treated by making healthy lifestyle changes, such as eating healthy foods, exercising more, and reducing your alcohol intake. You may be referred for counseling on a healthy diet and physical activity. Your health care provider may prescribe medicine if lifestyle changes are not enough to get your blood pressure under control and if: Your systolic blood pressure is above 130. Your diastolic blood pressure is above 80. Your personal target blood pressure may vary depending on your medical conditions, your age, and other factors. Follow these instructions at home: Eating and drinking  Eat a diet that is high in fiber and potassium, and low in sodium, added sugar, and fat. An example of this eating plan is called the DASH diet. DASH stands for Dietary Approaches to Stop Hypertension. To eat this way: Eat   plenty of fresh fruits and vegetables. Try to fill one half of your plate at each meal with fruits and vegetables. Eat whole grains, such as whole-wheat pasta, brown rice, or whole-grain bread. Fill about one  fourth of your plate with whole grains. Eat or drink low-fat dairy products, such as skim milk or low-fat yogurt. Avoid fatty cuts of meat, processed or cured meats, and poultry with skin. Fill about one fourth of your plate with lean proteins, such as fish, chicken without skin, beans, eggs, or tofu. Avoid pre-made and processed foods. These tend to be higher in sodium, added sugar, and fat. Reduce your daily sodium intake. Many people with hypertension should eat less than 1,500 mg of sodium a day. Do not drink alcohol if: Your health care provider tells you not to drink. You are pregnant, may be pregnant, or are planning to become pregnant. If you drink alcohol: Limit how much you have to: 0-1 drink a day for women. 0-2 drinks a day for men. Know how much alcohol is in your drink. In the [place] one drink equals one 12 oz bottle of beer (355 mL), one 5 oz glass of wine (148 mL), or one 1 oz glass of hard liquor (44 mL). Lifestyle  Work with your health care provider to maintain a healthy body weight or to lose weight. Ask what an ideal weight is for you. Get at least 30 minutes of exercise that causes your heart to beat faster (aerobic exercise) most days of the week. Activities may include walking, swimming, or biking. Include exercise to strengthen your muscles (resistance exercise), such as Pilates or lifting weights, as part of your weekly exercise routine. Try to do these types of exercises for 30 minutes at least 3 days a week. Do not use any products that contain nicotine or tobacco. These products include cigarettes, chewing tobacco, and vaping devices, such as e-cigarettes. If you need help quitting, ask your health care provider. Monitor your blood pressure at home as told by your health care provider. Keep all follow-up visits. This is important. Medicines Take over-the-counter and prescription medicines only as told by your health care provider. Follow directions carefully. Blood  pressure medicines must be taken as prescribed. Do not skip doses of blood pressure medicine. Doing this puts you at risk for problems and can make the medicine less effective. Ask your health care provider about side effects or reactions to medicines that you should watch for. Contact a health care provider if you: Think you are having a reaction to a medicine you are taking. Have headaches that keep coming back (recurring). Feel dizzy. Have swelling in your ankles. Have trouble with your vision. Get help right away if you: Develop a severe headache or confusion. Have unusual weakness or numbness. Feel faint. Have severe pain in your chest or abdomen. Vomit repeatedly. Have trouble breathing. These symptoms may be an emergency. Get help right away. Call 911. Do not wait to see if the symptoms will go away. Do not drive yourself to the hospital. Summary Hypertension is when the force of blood pumping through your arteries is too strong. If this condition is not controlled, it may put you at risk for serious complications. Your personal target blood pressure may vary depending on your medical conditions, your age, and other factors. For most people, a normal blood pressure is less than 120/80. Hypertension is treated with lifestyle changes, medicines, or a combination of both. Lifestyle changes include losing weight, eating a healthy,   low-sodium diet, exercising more, and limiting alcohol. This information is not intended to replace advice given to you by your health care provider. Make sure you discuss any questions you have with your health care provider. Document Revised: 08/04/2021 Document Reviewed: 08/04/2021 Elsevier Patient Education  2024 [employer] Inc[employer]  

## 2023-06-14 ENCOUNTER — Encounter: Payer: Self-pay | Admitting: Internal Medicine

## 2023-06-14 ENCOUNTER — Ambulatory Visit: Payer: BC Managed Care – PPO | Admitting: Internal Medicine

## 2023-06-14 VITALS — BP 130/80 | HR 95 | Temp 98.4°F | Ht 67.0 in | Wt 242.0 lb

## 2023-06-14 DIAGNOSIS — R7309 Other abnormal glucose: Secondary | ICD-10-CM

## 2023-06-14 DIAGNOSIS — I10 Essential (primary) hypertension: Secondary | ICD-10-CM

## 2023-06-14 DIAGNOSIS — L304 Erythema intertrigo: Secondary | ICD-10-CM

## 2023-06-14 DIAGNOSIS — E7800 Pure hypercholesterolemia, unspecified: Secondary | ICD-10-CM

## 2023-06-14 DIAGNOSIS — Z6837 Body mass index (BMI) 37.0-37.9, adult: Secondary | ICD-10-CM

## 2023-06-14 MED ORDER — AMLODIPINE BESYLATE 2.5 MG PO TABS: 2.5000 mg | ORAL_TABLET | Freq: Every day | ORAL | 2 refills | Status: DC

## 2023-06-14 MED ORDER — TELMISARTAN-HCTZ 80-25 MG PO TABS: 1.0000 | ORAL_TABLET | Freq: Every day | ORAL | 2 refills | Status: DC

## 2023-06-14 MED ORDER — NYSTATIN 100000 UNIT/GM EX CREA: TOPICAL_CREAM | Freq: Two times a day (BID) | CUTANEOUS | 1 refills | Status: DC | PRN

## 2023-06-14 NOTE — Assessment & Plan Note (Signed)
Chronic, I will refill nystatin cream as requested.

## 2023-06-14 NOTE — Assessment & Plan Note (Signed)
Chronic, currently on simvastatin. She is encouraged to follow a heart healthy lifestyle - clean eating, regular exercise, 7-8 hours of sleep nightly and stress management.

## 2023-06-14 NOTE — Assessment & Plan Note (Signed)
Previous labs reviewed, her A1c has been elevated in the past. I will check an A1c today. Reminded to avoid refined sugars including sugary drinks/foods and processed meats including bacon, sausages and deli meats.  

## 2023-06-14 NOTE — Assessment & Plan Note (Signed)
Chronic, fair control. Pt advised goal BP<120/80.  She will continue with amlodipine 2.5mg  daily and telmisartan/hct 80/25mg  daily. She is encouraged to follow a low sodium diet. She will rto in six months for CPE.

## 2023-06-14 NOTE — Progress Notes (Signed)
I,Victoria T Deloria Lair, CMA,acting as a Neurosurgeon for Gwynneth Aliment, MD.,have documented all relevant documentation on the behalf of Gwynneth Aliment, MD,as directed by  Gwynneth Aliment, MD while in the presence of Gwynneth Aliment, MD.  Subjective:  Patient ID: Sara Short , female    DOB: 1960-10-21 , 62 y.o.   MRN: [id number]  Chief Complaint  Patient presents with   Hypertension    HPI  Patient presents today for a BP check, patient reports compliance with medications. She denies headaches, chest pain and shortness of breath.  She reports no specific questions or concerns.   Hypertension This is a chronic problem. The current episode started more than 1 year ago. The problem has been gradually improving since onset. The problem is uncontrolled. Pertinent negatives include no blurred vision, chest pain, palpitations or shortness of breath. Risk factors for coronary artery disease include obesity, post-menopausal state and sedentary lifestyle. The current treatment provides moderate improvement. Compliance problems include exercise.      Past Medical History:  Diagnosis Date   Allergic rhinitis    HTN (hypertension)    Obesity      Family History  Problem Relation Age of Onset   Diverticulitis Mother    Hypertension Mother    Diabetes Father      Current Outpatient Medications:    Chlorpheniramine Maleate (ALLERGY PO), Take by mouth., Disp: , Rfl:    Cholecalciferol (VITAMIN D3) 25 MCG (1000 UT) CAPS, Take 2 capsules (2,000 Units total) by mouth daily. 1 per day, Disp: 60 capsule, Rfl: 1   Magnesium 400 MG CAPS, Take 400 mg by mouth Nightly., Disp: 90 capsule, Rfl: 1   Multiple Vitamin (MULTIVITAMIN) capsule, Take 1 capsule by mouth daily., Disp: , Rfl:    predniSONE (DELTASONE) 10 MG tablet, TAKE 1 TABLET(10 MG) BY MOUTH DAILY WITH BREAKFAST, Disp: 30 tablet, Rfl: 0   simvastatin (ZOCOR) 20 MG tablet, TAKE 1 TABLET(20 MG) BY MOUTH AT BEDTIME, Disp: 90 tablet, Rfl: 2    amLODipine (NORVASC) 2.5 MG tablet, Take 1 tablet (2.5 mg total) by mouth daily. TAKE 1 TABLET(2.5 MG) BY MOUTH AT BEDTIME, Disp: 90 tablet, Rfl: 2   methocarbamol (ROBAXIN) 500 MG tablet, Take 1 tablet (500 mg total) by mouth every 6 (six) hours as needed for muscle spasms. (Patient not taking: Reported on 06/14/2023), Disp: 20 tablet, Rfl: 0   nitroGLYCERIN (NITRODUR - DOSED IN MG/24 HR) 0.2 mg/hr patch, Place 1/4 patch on affected area of achilles. (Patient not taking: Reported on 06/14/2023), Disp: 30 patch, Rfl: 0   nystatin cream (MYCOSTATIN), Apply topically 2 (two) times daily as needed for dry skin (intertrigo). APPLY TOPICALLY TO THE AFFECTED AREA TWICE DAILY as needed, Disp: 30 g, Rfl: 1   telmisartan-hydrochlorothiazide (MICARDIS HCT) 80-25 MG tablet, Take 1 tablet by mouth daily., Disp: 90 tablet, Rfl: 2   Allergies  Allergen Reactions   Crestor [Rosuvastatin Calcium] Hives   Lipitor [Atorvastatin Calcium] Hives     Review of Systems  Constitutional: Negative.   Eyes:  Negative for blurred vision.  Respiratory: Negative.  Negative for shortness of breath.   Cardiovascular: Negative.  Negative for chest pain and palpitations.  Skin:  Positive for rash.       C/o rash underneath her breasts.  Neurological: Negative.   Psychiatric/Behavioral: Negative.       Today's Vitals   06/14/23 0834  BP: 130/80  Pulse: 95  Temp: 98.4 F (36.9 C)  SpO2: 98%  Weight: 242 lb (109.8 kg)  Height: 5\' 7"  (1.702 m)   Body mass index is 37.9 kg/m.  Wt Readings from Last 3 Encounters:  06/14/23 242 lb (109.8 kg)  12/14/22 251 lb 9.6 oz (114.1 kg)  06/03/22 235 lb 6.4 oz (106.8 kg)     Objective:  Physical Exam Vitals and nursing note reviewed.  Constitutional:      Appearance: Normal appearance. She is obese.  HENT:     Head: Normocephalic and atraumatic.  Eyes:     Extraocular Movements: Extraocular movements intact.  Cardiovascular:     Rate and Rhythm: Normal rate and regular  rhythm.     Heart sounds: Normal heart sounds.  Pulmonary:     Effort: Pulmonary effort is normal.     Breath sounds: Normal breath sounds.  Musculoskeletal:     Cervical back: Normal range of motion.  Skin:    General: Skin is warm.  Neurological:     General: No focal deficit present.     Mental Status: She is alert.  Psychiatric:        Mood and Affect: Mood normal.        Behavior: Behavior normal.         Assessment And Plan:  Essential hypertension, benign Assessment & Plan: Chronic, fair control. Pt advised goal BP<120/80.  She will continue with amlodipine 2.5mg  daily and telmisartan/hct 80/25mg  daily. She is encouraged to follow a low sodium diet. She will rto in six months for CPE.   Orders: -     CMP14+EGFR -     TSH  Pure hypercholesterolemia Assessment & Plan: Chronic, currently on simvastatin. She is encouraged to follow a heart healthy lifestyle - clean eating, regular exercise, 7-8 hours of sleep nightly and stress management.   Orders: -     Lipid panel  Other abnormal glucose Assessment & Plan: Previous labs reviewed, her A1c has been elevated in the past. I will check an A1c today. Reminded to avoid refined sugars including sugary drinks/foods and processed meats including bacon, sausages and deli meats.    Orders: -     Hemoglobin A1c  Intertrigo Assessment & Plan: Chronic, I will refill nystatin cream as requested.    Class 2 severe obesity due to excess calories with serious comorbidity and body mass index (BMI) of 37.0 to 37.9 in adult [facility]) Assessment & Plan: She was congratulated on her 9lb weight loss and encouraged to keep up the great work. Reminded to aim for at least 150 minutes of exercise/week.    Other orders -     Nystatin; Apply topically 2 (two) times daily as needed for dry skin (intertrigo). APPLY TOPICALLY TO THE AFFECTED AREA TWICE DAILY as needed  Dispense: 30 g; Refill: 1 -     amLODIPine Besylate; Take 1 tablet (2.5 mg  total) by mouth daily. TAKE 1 TABLET(2.5 MG) BY MOUTH AT BEDTIME  Dispense: 90 tablet; Refill: 2 -     Telmisartan-HCTZ; Take 1 tablet by mouth daily.  Dispense: 90 tablet; Refill: 2  She is encouraged to strive for BMI less than 30 to decrease cardiac risk. Advised to aim for at least 150 minutes of exercise per week.    Return if symptoms worsen or fail to improve.  Patient was given opportunity to ask questions. Patient verbalized understanding of the plan and was able to repeat key elements of the plan. All questions were answered to their satisfaction.    I, Gwynneth Aliment, MD,  have reviewed all documentation for this visit. The documentation on 06/14/23 for the exam, diagnosis, procedures, and orders are all accurate and complete.   IF YOU HAVE BEEN REFERRED TO A SPECIALIST, IT MAY TAKE 1-2 WEEKS TO SCHEDULE/PROCESS THE REFERRAL. IF YOU HAVE NOT HEARD FROM US/SPECIALIST IN TWO WEEKS, PLEASE GIVE [name] A CALL AT [phone] X [phone].   THE PATIENT IS ENCOURAGED TO PRACTICE SOCIAL DISTANCING DUE TO THE COVID-19 PANDEMIC.

## 2023-06-14 NOTE — Assessment & Plan Note (Signed)
She was congratulated on her 9lb weight loss and encouraged to keep up the great work. Reminded to aim for at least 150 minutes of exercise/week.

## 2023-06-15 LAB — CMP14+EGFR
ALT: 18 IU/L (ref 0–32)
AST: 25 IU/L (ref 0–40)
Albumin: 3.9 g/dL (ref 3.9–4.9)
Alkaline Phosphatase: 68 IU/L (ref 44–121)
BUN/Creatinine Ratio: 16 (ref 12–28)
BUN: 12 mg/dL (ref 8–27)
Bilirubin Total: 0.2 mg/dL (ref 0.0–1.2)
CO2: 24 mmol/L (ref 20–29)
Calcium: 9.6 mg/dL (ref 8.7–10.3)
Chloride: 104 mmol/L (ref 96–106)
Creatinine, Ser: 0.75 mg/dL (ref 0.57–1.00)
Globulin, Total: 3 g/dL (ref 1.5–4.5)
Glucose: 85 mg/dL (ref 70–99)
Potassium: 3.8 mmol/L (ref 3.5–5.2)
Sodium: 142 mmol/L (ref 134–144)
Total Protein: 6.9 g/dL (ref 6.0–8.5)
eGFR: 91 mL/min/{1.73_m2} (ref >59)

## 2023-06-15 LAB — LIPID PANEL
Chol/HDL Ratio: 4.1 ratio (ref 0.0–4.4)
Cholesterol, Total: 148 mg/dL (ref 100–199)
HDL: 36 mg/dL — ABNORMAL LOW (ref >39)
LDL Chol Calc (NIH): 85 mg/dL (ref 0–99)
Triglycerides: 155 mg/dL — ABNORMAL HIGH (ref 0–149)
VLDL Cholesterol Cal: 27 mg/dL (ref 5–40)

## 2023-06-15 LAB — HEMOGLOBIN A1C
Est. average glucose Bld gHb Est-mCnc: 117 mg/dL
Hgb A1c MFr Bld: 5.7 % — ABNORMAL HIGH (ref 4.8–5.6)

## 2023-06-15 LAB — TSH: TSH: 3 u[IU]/mL (ref 0.450–4.500)

## 2023-06-24 ENCOUNTER — Other Ambulatory Visit: Payer: Self-pay | Admitting: Family

## 2023-07-24 ENCOUNTER — Other Ambulatory Visit: Payer: Self-pay | Admitting: Family

## 2023-07-25 ENCOUNTER — Other Ambulatory Visit: Payer: Self-pay | Admitting: Internal Medicine

## 2023-08-20 ENCOUNTER — Other Ambulatory Visit: Payer: Self-pay | Admitting: Orthopedic Surgery

## 2023-09-19 ENCOUNTER — Other Ambulatory Visit: Payer: Self-pay | Admitting: Orthopedic Surgery

## 2023-10-21 ENCOUNTER — Other Ambulatory Visit: Payer: Self-pay | Admitting: Internal Medicine

## 2023-10-21 DIAGNOSIS — Z0000 Encounter for general adult medical examination without abnormal findings: Secondary | ICD-10-CM

## 2023-11-22 ENCOUNTER — Ambulatory Visit: Payer: Self-pay

## 2023-11-23 ENCOUNTER — Ambulatory Visit: Payer: Self-pay

## 2023-12-02 ENCOUNTER — Ambulatory Visit
Admission: RE | Admit: 2023-12-02 | Discharge: 2023-12-02 | Disposition: A | Discharge status: Home / Self Care | Payer: 59 | Source: Ambulatory Visit | Attending: Internal Medicine | Admitting: Internal Medicine

## 2023-12-02 DIAGNOSIS — Z0000 Encounter for general adult medical examination without abnormal findings: Secondary | ICD-10-CM

## 2023-12-12 ENCOUNTER — Telehealth: Payer: Self-pay | Admitting: Internal Medicine

## 2023-12-12 ENCOUNTER — Other Ambulatory Visit: Payer: Self-pay | Admitting: Internal Medicine

## 2023-12-12 MED ORDER — NYSTATIN 100000 UNIT/GM EX CREA: TOPICAL_CREAM | Freq: Two times a day (BID) | CUTANEOUS | 1 refills | Status: AC | PRN

## 2023-12-12 NOTE — Telephone Encounter (Unsigned)
 Copied from CRM [id number]. Topic: Clinical - Medication Refill >> Dec 12, 2023 11:41 AM Alfred Levins wrote: Most Recent Primary Care Visit:  Provider: Dorothyann Peng  Department: [facility]  Visit Type: OFFICE VISIT  Date: 06/14/2023  Medication:  nystatin (NYSTATIN) powder [39136]  Has the patient contacted their pharmacy? Yes (Agent: If no, request that the patient contact the pharmacy for the refill. If patient does not wish to contact the pharmacy document the reason why and proceed with request.) (Agent: If yes, when and what did the pharmacy advise?)  Is this the correct pharmacy for this prescription? Yes If no, delete pharmacy and type the correct one.  This is the patient's preferred pharmacy:  [facility] [phone] - [place], [place] - [address] AT [facility] [address] [place] [address] Phone: [phone] Fax: [phone]    Has the prescription been filled recently? No  Is the patient out of the medication? Yes  Has the patient been seen for an appointment in the last year OR does the patient have an upcoming appointment? Yes  Can we respond through MyChart? Yes  Agent: Please be advised that Rx refills may take up to 3 business days. We ask that you follow-up with your pharmacy.

## 2023-12-12 NOTE — Telephone Encounter (Signed)
 Copied from CRM [id number]. Topic: Clinical - Medication Refill >> Dec 12, 2023 11:41 AM Alfred Levins wrote: Most Recent Primary Care Visit:  Provider: Dorothyann Peng  Department: [facility]  Visit Type: OFFICE VISIT  Date: 06/14/2023  Medication:  nystatin (NYSTATIN) powder [39136]  Has the patient contacted their pharmacy? Yes (Agent: If no, request that the patient contact the pharmacy for the refill. If patient does not wish to contact the pharmacy document the reason why and proceed with request.) (Agent: If yes, when and what did the pharmacy advise?)  Is this the correct pharmacy for this prescription? Yes If no, delete pharmacy and type the correct one.  This is the patient's preferred pharmacy:  [facility] [phone] - [place], [place] - [address] AT [facility] [address] [place] [address] Phone: [phone] Fax: [phone]    Has the prescription been filled recently? No  Is the patient out of the medication? Yes  Has the patient been seen for an appointment in the last year OR does the patient have an upcoming appointment? Yes  Can we respond through MyChart? Yes  Agent: Please be advised that Rx refills may take up to 3 business days. We ask that you follow-up with your pharmacy.

## 2023-12-12 NOTE — Telephone Encounter (Signed)
 Duplicated by agent in error. See previous encounter for Nystatin rx request.

## 2023-12-22 ENCOUNTER — Encounter: Payer: Self-pay | Admitting: Internal Medicine

## 2024-01-23 ENCOUNTER — Encounter: Payer: Self-pay | Admitting: Internal Medicine

## 2024-01-23 ENCOUNTER — Ambulatory Visit (INDEPENDENT_AMBULATORY_CARE_PROVIDER_SITE_OTHER): Payer: BC Managed Care – PPO | Admitting: Internal Medicine

## 2024-01-23 VITALS — BP 128/82 | HR 72 | Temp 98.2°F | Ht 67.0 in | Wt 240.0 lb

## 2024-01-23 DIAGNOSIS — E7800 Pure hypercholesterolemia, unspecified: Secondary | ICD-10-CM | POA: Diagnosis not present

## 2024-01-23 DIAGNOSIS — K219 Gastro-esophageal reflux disease without esophagitis: Secondary | ICD-10-CM | POA: Diagnosis not present

## 2024-01-23 DIAGNOSIS — Z0000 Encounter for general adult medical examination without abnormal findings: Secondary | ICD-10-CM

## 2024-01-23 DIAGNOSIS — I10 Essential (primary) hypertension: Secondary | ICD-10-CM

## 2024-01-23 DIAGNOSIS — L304 Erythema intertrigo: Secondary | ICD-10-CM | POA: Diagnosis not present

## 2024-01-23 DIAGNOSIS — E66812 Obesity, class 2: Secondary | ICD-10-CM

## 2024-01-23 DIAGNOSIS — Z6837 Body mass index (BMI) 37.0-37.9, adult: Secondary | ICD-10-CM

## 2024-01-23 LAB — POCT URINALYSIS DIPSTICK
Bilirubin, UA: NEGATIVE
Blood, UA: NEGATIVE
Glucose, UA: NEGATIVE
Ketones, UA: NEGATIVE
Leukocytes, UA: NEGATIVE
Nitrite, UA: NEGATIVE
Protein, UA: NEGATIVE
Spec Grav, UA: 1.025 (ref 1.010–1.025)
Urobilinogen, UA: 0.2 U/dL
pH, UA: 5.5 (ref 5.0–8.0)

## 2024-01-23 MED ORDER — FAMOTIDINE 20 MG PO TABS: 20.0000 mg | ORAL_TABLET | Freq: Every day | ORAL | 1 refills | Status: DC

## 2024-01-23 NOTE — Assessment & Plan Note (Signed)

## 2024-01-23 NOTE — Progress Notes (Signed)
 I,Sara Short, CMA,acting as a Neurosurgeon for Sara Dung, MD.,have documented all relevant documentation on the behalf of Sara Dung, MD,as directed by  Sara Dung, MD while in the presence of Sara Dung, MD.  Subjective:    Patient ID: Sara Short , female    DOB: [birth year] , 62 y.o.   MRN: [id number]  Chief Complaint  Patient presents with   Annual Exam    Patient presents today for annual exam. She reports compliance with medications. Denies headache, chest pain & sob. She is not currently established with GYN.    Hypertension   Hyperlipidemia    Discussed the use of AI scribe software for clinical note transcription with the patient, who gave verbal consent to proceed.  History of Present Illness Sara Short is a 63 year old female who presents for an annual physical exam and BP check.  She has a rash underneath her breasts that tends to occur during warm weather. A dermatologist prescribed a compounded cream from a pharmacy on [address], which has partially cleared it up.  She experiences acid reflux, described as a sensation in her upper abdomen, and reports frequent belching.   She notes some swelling and acknowledges the need to reduce her salt intake.   Hypertension This is a chronic problem. The current episode started more than 1 year ago. The problem has been gradually improving since onset. The problem is controlled. Pertinent negatives include no blurred vision, chest pain, palpitations or shortness of breath. The current treatment provides moderate improvement. Compliance problems include exercise.      Past Medical History:  Diagnosis Date   Allergic rhinitis    HTN (hypertension)    Obesity      Family History  Problem Relation Age of Onset   Diverticulitis Mother    Hypertension Mother    Diabetes Father      Current Outpatient Medications:    amLODipine  (NORVASC ) 2.5 MG tablet, Take 1 tablet (2.5 mg  total) by mouth daily. TAKE 1 TABLET(2.5 MG) BY MOUTH AT BEDTIME, Disp: 90 tablet, Rfl: 2   Chlorpheniramine Maleate (ALLERGY PO), Take by mouth., Disp: , Rfl:    Cholecalciferol (VITAMIN D3) 25 MCG (1000 UT) CAPS, Take 2 capsules (2,000 Units total) by mouth daily. 1 per day, Disp: 60 capsule, Rfl: 1   famotidine  (PEPCID ) 20 MG tablet, Take 1 tablet (20 mg total) by mouth daily., Disp: 30 tablet, Rfl: 1   Magnesium  400 MG CAPS, Take 400 mg by mouth Nightly., Disp: 90 capsule, Rfl: 1   Multiple Vitamin (MULTIVITAMIN) capsule, Take 1 capsule by mouth daily., Disp: , Rfl:    nystatin  cream (MYCOSTATIN ), Apply topically 2 (two) times daily as needed for dry skin (intertrigo). APPLY TOPICALLY TO THE AFFECTED AREA TWICE DAILY as needed, Disp: 30 g, Rfl: 1   predniSONE  (DELTASONE ) 10 MG tablet, TAKE 1 TABLET(10 MG) BY MOUTH DAILY WITH BREAKFAST, Disp: 30 tablet, Rfl: 0   simvastatin  (ZOCOR ) 20 MG tablet, TAKE 1 TABLET(20 MG) BY MOUTH AT BEDTIME, Disp: 90 tablet, Rfl: 2   telmisartan -hydrochlorothiazide (MICARDIS  HCT) 80-25 MG tablet, Take 1 tablet by mouth daily., Disp: 90 tablet, Rfl: 2   methocarbamol  (ROBAXIN ) 500 MG tablet, Take 1 tablet (500 mg total) by mouth every 6 (six) hours as needed for muscle spasms. (Patient not taking: Reported on 01/23/2024), Disp: 20 tablet, Rfl: 0   nitroGLYCERIN  (NITRODUR - DOSED IN MG/24 HR) 0.2 mg/hr patch, Place 1/4 patch  on affected area of achilles. (Patient not taking: Reported on 01/23/2024), Disp: 30 patch, Rfl: 0   Allergies  Allergen Reactions   Crestor [Rosuvastatin Calcium] Hives   Lipitor [Atorvastatin Calcium] Hives      The patient states she uses post menopausal status for birth control. Patient's last menstrual period was 12/24/2020.. Negative for Dysmenorrhea. Negative for: breast discharge, breast lump(s), breast pain and breast self exam. Associated symptoms include abnormal vaginal bleeding. Pertinent negatives include abnormal bleeding  (hematology), anxiety, decreased libido, depression, difficulty falling sleep, dyspareunia, history of infertility, nocturia, sexual dysfunction, sleep disturbances, urinary incontinence, urinary urgency, vaginal discharge and vaginal itching. Diet regular.The patient states her exercise level is    . The patient's tobacco use is:  Social History   Tobacco Use  Smoking Status Former   Current packs/day: 0.00   Average packs/day: 0.5 packs/day for 20.0 years (10.0 ttl pk-yrs)   Types: Cigarettes   Start date: 03/12/1999   Quit date: 03/12/2019   Years since quitting: 4.8  Smokeless Tobacco Never  Tobacco Comments   She quit in June  . She has been exposed to passive smoke. The patient's alcohol use is:  Social History   Substance and Sexual Activity  Alcohol Use Never     Review of Systems  Constitutional: Negative.   HENT: Negative.    Eyes: Negative.  Negative for blurred vision.  Respiratory: Negative.  Negative for shortness of breath.   Cardiovascular: Negative.  Negative for chest pain and palpitations.  Gastrointestinal:        POS reflux/heartburn  Endocrine: Negative.   Genitourinary: Negative.   Musculoskeletal: Negative.   Skin: Negative.        Slight hyperpigmentation beneath both breasts, no erythema noted  Allergic/Immunologic: Negative.   Neurological: Negative.   Hematological: Negative.   Psychiatric/Behavioral: Negative.       Today's Vitals   01/23/24 0846  BP: 128/82  Pulse: 72  Temp: 98.2 F (36.8 C)  SpO2: 98%  Weight: 240 lb (108.9 kg)  Height: 5\' 7"  (1.702 m)   Body mass index is 37.59 kg/m.  Wt Readings from Last 3 Encounters:  01/23/24 240 lb (108.9 kg)  06/14/23 242 lb (109.8 kg)  12/14/22 251 lb 9.6 oz (114.1 kg)     Objective:  Physical Exam Vitals and nursing note reviewed.  Constitutional:      Appearance: Normal appearance. She is obese.  HENT:     Head: Normocephalic and atraumatic.     Right Ear: Tympanic membrane, ear  canal and external ear normal.     Left Ear: Tympanic membrane, ear canal and external ear normal.     Nose: Nose normal.     Mouth/Throat:     Mouth: Mucous membranes are moist.     Pharynx: Oropharynx is clear.  Eyes:     Extraocular Movements: Extraocular movements intact.     Conjunctiva/sclera: Conjunctivae normal.     Pupils: Pupils are equal, round, and reactive to light.  Cardiovascular:     Rate and Rhythm: Normal rate and regular rhythm.     Pulses: Normal pulses.     Heart sounds: Normal heart sounds.  Pulmonary:     Effort: Pulmonary effort is normal.     Breath sounds: Normal breath sounds.  Abdominal:     General: Bowel sounds are normal.     Palpations: Abdomen is soft.  Genitourinary:    Comments: deferred Musculoskeletal:        General: Normal range  of motion.     Cervical back: Normal range of motion and neck supple.     Right lower leg: Edema present.     Left lower leg: Edema present.  Skin:    General: Skin is warm and dry.  Neurological:     General: No focal deficit present.     Mental Status: She is alert and oriented to person, place, and time.  Psychiatric:        Mood and Affect: Mood normal.        Behavior: Behavior normal.         Assessment And Plan:     Encounter for general adult medical examination w/o abnormal findings Assessment & Plan: A full exam was performed.  Importance of monthly self breast exams was discussed with the patient.  She is advised to get 30-45 minutes of regular exercise, no less than four to five days per week. Both weight-bearing and aerobic exercises are recommended.  She is advised to follow a healthy diet with at least six fruits/veggies per day, decrease intake of red meat and other saturated fats and to increase fish intake to twice weekly.  Meats/fish should not be fried -- baked, boiled or broiled is preferable. It is also important to cut back on your sugar intake.  Be sure to read labels - try to avoid  anything with added sugar, high fructose corn syrup or other sweeteners.  If you must use a sweetener, you can try stevia or monkfruit.  It is also important to avoid artificially sweetened foods/beverages and diet drinks. Lastly, wear SPF 50 sunscreen on exposed skin and when in direct sunlight for an extended period of time.  Be sure to avoid fast food restaurants and aim for at least 60 ounces of water daily.      Orders: -     CBC -     CMP14+EGFR -     Lipid panel -     Hemoglobin A1c  Essential hypertension, benign Assessment & Plan: Chronic, fair control. Goal BP<120/80.  EKG performed, NSR w/o acute changes.  Blood pressure slightly above target. Discussed cardiac calcium score test for coronary artery plaque assessment. - Encourage regular exercise. - Recommend cardiac calcium score test. - Advise reducing salt intake.  Orders: -     EKG 12-Lead -     POCT urinalysis dipstick -     Microalbumin / creatinine urine ratio  Pure hypercholesterolemia Assessment & Plan: Discussed cardiac calcium score test for coronary artery plaque assessment.  She is aware the cost is $99.  Orders: -     Lipoprotein A (LPA)  Gastroesophageal reflux disease without esophagitis Assessment & Plan: Advised to avoid eating three hours before lying down to reduce symptoms. - Advise avoiding eating three hours before lying down. - Recommend dietary modifications - Will send rx famotidine  20mg  taken daily. - She does not wish to return for f/u, will let me know if her sx improve   Intertrigo Assessment & Plan: Rash under breasts exacerbated by warm weather. Recommended Gold Bond powder and prescribed cream for management. - Recommend Gold Bond powder during the day. - Advise using prescribed cream at night.   Class 2 severe obesity due to excess calories with serious comorbidity and body mass index (BMI) of 37.0 to 37.9 in adult [facility]) Assessment & Plan: She was congratulated on maintaining  her weight loss. Encouraged to strive to lose another 10 pounds before her next visit in six months. Advised  to aim for at least 150 minutes of exercise per week.    Other orders -     Famotidine ; Take 1 tablet (20 mg total) by mouth daily.  Dispense: 30 tablet; Refill: 1  General Health Maintenance Discussed general health screenings and preventive measures. Cardiac calcium score information to be reviewed in MyChart. - Schedule Pap smear for 2026. - Perform liver, kidney, and cholesterol tests. - Review cardiac calcium score information in MyChart.  Return for 1 YEAR hm w/ pap, 6 month bp & chol f/u.Aaron Aas Patient was given opportunity to ask questions. Patient verbalized understanding of the plan and was able to repeat key elements of the plan. All questions were answered to their satisfaction.    I, Sara Dung, MD, have reviewed all documentation for this visit. The documentation on 01/23/24 for the exam, diagnosis, procedures, and orders are all accurate and complete.

## 2024-01-23 NOTE — Assessment & Plan Note (Signed)
 She was congratulated on maintaining her weight loss. Encouraged to strive to lose another 10 pounds before her next visit in six months. Advised to aim for at least 150 minutes of exercise per week.

## 2024-01-23 NOTE — Assessment & Plan Note (Signed)
 Discussed cardiac calcium score test for coronary artery plaque assessment.  She is aware the cost is $99.

## 2024-01-23 NOTE — Assessment & Plan Note (Signed)
 Chronic, fair control. Goal BP<120/80.  EKG performed, NSR w/o acute changes.  Blood pressure slightly above target. Discussed cardiac calcium score test for coronary artery plaque assessment. - Encourage regular exercise. - Recommend cardiac calcium score test. - Advise reducing salt intake.

## 2024-01-23 NOTE — Patient Instructions (Signed)

## 2024-01-23 NOTE — Assessment & Plan Note (Signed)
 Advised to avoid eating three hours before lying down to reduce symptoms. - Advise avoiding eating three hours before lying down. - Recommend dietary modifications - Will send rx famotidine 20mg  taken daily. - She does not wish to return for f/u, will let me know if her sx improve

## 2024-01-23 NOTE — Assessment & Plan Note (Signed)
 Rash under breasts exacerbated by warm weather. Recommended Gold Bond powder and prescribed cream for management. - Recommend Gold Bond powder during the day. - Advise using prescribed cream at night.

## 2024-01-24 LAB — LIPID PANEL
Chol/HDL Ratio: 4.5 ratio — ABNORMAL HIGH (ref 0.0–4.4)
Cholesterol, Total: 171 mg/dL (ref 100–199)
HDL: 38 mg/dL — ABNORMAL LOW (ref >39)
LDL Chol Calc (NIH): 105 mg/dL — ABNORMAL HIGH (ref 0–99)
Triglycerides: 159 mg/dL — ABNORMAL HIGH (ref 0–149)
VLDL Cholesterol Cal: 28 mg/dL (ref 5–40)

## 2024-01-24 LAB — CMP14+EGFR
ALT: 17 IU/L (ref 0–32)
AST: 22 IU/L (ref 0–40)
Albumin: 4 g/dL (ref 3.9–4.9)
Alkaline Phosphatase: 80 IU/L (ref 44–121)
BUN/Creatinine Ratio: 15 (ref 12–28)
BUN: 10 mg/dL (ref 8–27)
Bilirubin Total: 0.4 mg/dL (ref 0.0–1.2)
CO2: 25 mmol/L (ref 20–29)
Calcium: 9.6 mg/dL (ref 8.7–10.3)
Chloride: 103 mmol/L (ref 96–106)
Creatinine, Ser: 0.68 mg/dL (ref 0.57–1.00)
Globulin, Total: 3 g/dL (ref 1.5–4.5)
Glucose: 77 mg/dL (ref 70–99)
Potassium: 3.5 mmol/L (ref 3.5–5.2)
Sodium: 142 mmol/L (ref 134–144)
Total Protein: 7 g/dL (ref 6.0–8.5)
eGFR: 99 mL/min/{1.73_m2} (ref >59)

## 2024-01-24 LAB — CBC
Hematocrit: 42 % (ref 34.0–46.6)
Hemoglobin: 13.6 g/dL (ref 11.1–15.9)
MCH: 29.8 pg (ref 26.6–33.0)
MCHC: 32.4 g/dL (ref 31.5–35.7)
MCV: 92 fL (ref 79–97)
Platelets: 275 10*3/uL (ref 150–450)
RBC: 4.56 x10E6/uL (ref 3.77–5.28)
RDW: 13.6 % (ref 11.7–15.4)
WBC: 6.1 10*3/uL (ref 3.4–10.8)

## 2024-01-24 LAB — MICROALBUMIN / CREATININE URINE RATIO
Creatinine, Urine: 131.1 mg/dL
Microalb/Creat Ratio: 4 mg/g{creat} (ref 0–29)
Microalbumin, Urine: 4.7 ug/mL

## 2024-01-24 LAB — HEMOGLOBIN A1C
Est. average glucose Bld gHb Est-mCnc: 120 mg/dL
Hgb A1c MFr Bld: 5.8 % — ABNORMAL HIGH (ref 4.8–5.6)

## 2024-01-24 LAB — LIPOPROTEIN A (LPA): Lipoprotein (a): 102.3 nmol/L — ABNORMAL HIGH (ref <75.0)

## 2024-03-20 ENCOUNTER — Other Ambulatory Visit: Payer: Self-pay | Admitting: Internal Medicine

## 2024-04-27 ENCOUNTER — Encounter: Payer: Self-pay | Admitting: Advanced Practice Midwife

## 2024-05-16 ENCOUNTER — Other Ambulatory Visit: Payer: Self-pay | Admitting: Internal Medicine

## 2024-05-30 ENCOUNTER — Other Ambulatory Visit: Payer: Self-pay

## 2024-05-30 ENCOUNTER — Encounter: Payer: Self-pay | Admitting: Family

## 2024-05-30 ENCOUNTER — Ambulatory Visit: Admitting: Family

## 2024-05-30 DIAGNOSIS — M25572 Pain in left ankle and joints of left foot: Secondary | ICD-10-CM | POA: Diagnosis not present

## 2024-05-30 DIAGNOSIS — M25571 Pain in right ankle and joints of right foot: Secondary | ICD-10-CM

## 2024-05-30 DIAGNOSIS — M25871 Other specified joint disorders, right ankle and foot: Secondary | ICD-10-CM

## 2024-05-30 DIAGNOSIS — M25872 Other specified joint disorders, left ankle and foot: Secondary | ICD-10-CM

## 2024-05-30 MED ORDER — LIDOCAINE HCL 1 % IJ SOLN
2.0000 mL | INTRAMUSCULAR | Status: AC | PRN
  Administered 2024-05-30: 2 mL

## 2024-05-30 MED ORDER — METHYLPREDNISOLONE ACETATE 40 MG/ML IJ SUSP
40.0000 mg | INTRAMUSCULAR | Status: AC | PRN
  Administered 2024-05-30: 40 mg via INTRA_ARTICULAR

## 2024-05-30 MED ORDER — METHYLPREDNISOLONE ACETATE 40 MG/ML IJ SUSP
40.0000 mg | INTRAMUSCULAR | Status: AC | PRN
Start: 2024-05-30 — End: 2024-05-30
  Administered 2024-05-30: 40 mg via INTRA_ARTICULAR

## 2024-05-30 NOTE — Progress Notes (Signed)
 Office Visit Note   Patient: Sara Short           Date of Birth: Oct 26, 1960           MRN: [id number] Visit Date: 05/30/2024              Requested by: Jarold Medici, MD [address] [place],  [place] [address] PCP: Jarold Medici, MD  No chief complaint on file.     HPI: The patient is a 63 year old woman who presents today complaining of chronic ankle pain that has been ongoing for years.  She reports gradual worsening of the course the day she works on her feet she has not had any instability no associated injuries  Assessment & Plan: Visit Diagnoses:  1. Pain in left ankle and joints of left foot   2. Pain in right ankle and joints of right foot     Plan: Depo-Medrol  injection bilateral ankles patient tolerated well. Plan to follow up in 4 weeks if fails to improve  Follow-Up Instructions: No follow-ups on file.   Right Ankle Exam   Tenderness  Right ankle tenderness location: sinus tarsi. Swelling: mild  Range of Motion  The patient has normal right ankle ROM.  Muscle Strength  The patient has normal right ankle strength.  Other  Erythema: absent Sensation: normal Pulse: present    Left Ankle Exam   Tenderness  Left ankle tenderness location: sinus taris.  Swelling: mild  Range of Motion  The patient has normal left ankle ROM.   Muscle Strength  The patient has normal left ankle strength.  Other  Erythema: absent Sensation: normal Pulse: present      Patient is alert, oriented, no adenopathy, well-dressed, normal affect, normal respiratory effort.     Imaging: No results found. No images are attached to the encounter.  Labs: Lab Results  Component Value Date   HGBA1C 5.8 (H) 01/23/2024   HGBA1C 5.7 (H) 06/14/2023   HGBA1C 5.8 (H) 12/14/2022     Lab Results  Component Value Date   ALBUMIN 4.0 01/23/2024   ALBUMIN 3.9 06/14/2023   ALBUMIN 4.4 12/14/2022    No results found for: MG Lab Results   Component Value Date   VD25OH 47.7 12/08/2021   VD25OH 46.8 12/01/2020   VD25OH 41.1 03/24/2020    No results found for: PREALBUMIN    Latest Ref Rng & Units 01/23/2024    9:54 AM 12/14/2022   12:49 PM 12/08/2021   11:53 AM  CBC EXTENDED  WBC 3.4 - 10.8 x10E3/uL 6.1  6.6  5.6   RBC 3.77 - 5.28 x10E6/uL 4.56  4.54  4.16   Hemoglobin 11.1 - 15.9 g/dL 86.3  86.4  87.0   HCT 34.0 - 46.6 % 42.0  41.6  37.9   Platelets 150 - 450 x10E3/uL 275  232  252      There is no height or weight on file to calculate BMI.  Orders:  Orders Placed This Encounter  Procedures   XR Ankle Complete Right   XR Ankle Complete Left   No orders of the defined types were placed in this encounter.    Procedures: Medium Joint Inj: bilateral ankle on 05/30/2024 4:25 PM Indications: pain Details: 25 G needle, anterolateral approach Medications (Right): 2 mL lidocaine  1 %; 40 mg methylPREDNISolone  acetate 40 MG/ML Medications (Left): 2 mL lidocaine  1 %; 40 mg methylPREDNISolone  acetate 40 MG/ML Outcome: tolerated well, no immediate complications Consent was given by the  patient. Patient was prepped and draped in the usual sterile fashion.      Clinical Data: No additional findings.  ROS:  All other systems negative, except as noted in the HPI. Review of Systems  Objective: Vital Signs: LMP 12/24/2020   Specialty Comments:  No specialty comments available.  PMFS History: Patient Active Problem List   Diagnosis Date Noted   Encounter for general adult medical examination w/o abnormal findings 01/23/2024   Gastroesophageal reflux disease without esophagitis 01/23/2024   Other abnormal glucose 06/14/2023   Intertrigo 06/14/2023   Pure hypercholesterolemia 06/14/2023   Class 2 severe obesity due to excess calories with serious comorbidity and body mass index (BMI) of 37.0 to 37.9 in adult [facility]) 12/15/2022   Achilles tendinitis of left lower extremity 12/15/2022   Lumbar radiculopathy  06/03/2022   Perimenopause 08/27/2021   Tachycardia 08/27/2021   Class 1 obesity due to excess calories with serious comorbidity and body mass index (BMI) of 34.0 to 34.9 in adult 08/27/2021   Essential hypertension, benign 01/02/2019   Primary insomnia 01/02/2019   Past Medical History:  Diagnosis Date   Allergic rhinitis    HTN (hypertension)    Obesity     Family History  Problem Relation Age of Onset   Diverticulitis Mother    Hypertension Mother    Diabetes Father     Past Surgical History:  Procedure Laterality Date   ECTOPIC PREGNANCY SURGERY  2003   Social History   Occupational History   Not on file  Tobacco Use   Smoking status: Former    Current packs/day: 0.00    Average packs/day: 0.5 packs/day for 20.0 years (10.0 ttl pk-yrs)    Types: Cigarettes    Start date: 03/12/1999    Quit date: 03/12/2019    Years since quitting: 5.2   Smokeless tobacco: Never   Tobacco comments:    She quit in June  Vaping Use   Vaping status: Never Used  Substance and Sexual Activity   Alcohol use: Never   Drug use: Never   Sexual activity: Not on file

## 2024-06-26 ENCOUNTER — Ambulatory Visit: Admitting: Family

## 2024-07-23 ENCOUNTER — Encounter: Payer: Self-pay | Admitting: Internal Medicine

## 2024-07-23 ENCOUNTER — Ambulatory Visit: Admitting: Internal Medicine

## 2024-07-23 ENCOUNTER — Other Ambulatory Visit: Payer: Self-pay | Admitting: Internal Medicine

## 2024-07-23 VITALS — BP 128/82 | HR 80 | Temp 98.4°F | Ht 67.0 in | Wt 242.0 lb

## 2024-07-23 DIAGNOSIS — E7800 Pure hypercholesterolemia, unspecified: Secondary | ICD-10-CM

## 2024-07-23 DIAGNOSIS — E66812 Obesity, class 2: Secondary | ICD-10-CM

## 2024-07-23 DIAGNOSIS — L304 Erythema intertrigo: Secondary | ICD-10-CM | POA: Diagnosis not present

## 2024-07-23 DIAGNOSIS — K219 Gastro-esophageal reflux disease without esophagitis: Secondary | ICD-10-CM

## 2024-07-23 DIAGNOSIS — Z6837 Body mass index (BMI) 37.0-37.9, adult: Secondary | ICD-10-CM

## 2024-07-23 DIAGNOSIS — R7303 Prediabetes: Secondary | ICD-10-CM | POA: Insufficient documentation

## 2024-07-23 DIAGNOSIS — I10 Essential (primary) hypertension: Secondary | ICD-10-CM | POA: Diagnosis not present

## 2024-07-23 LAB — CMP14+EGFR
ALT: 17 IU/L (ref 0–32)
AST: 23 IU/L (ref 0–40)
Albumin: 4 g/dL (ref 3.9–4.9)
Alkaline Phosphatase: 75 IU/L (ref 49–135)
BUN/Creatinine Ratio: 18 (ref 12–28)
BUN: 13 mg/dL (ref 8–27)
Bilirubin Total: 0.2 mg/dL (ref 0.0–1.2)
CO2: 24 mmol/L (ref 20–29)
Calcium: 9.7 mg/dL (ref 8.7–10.3)
Chloride: 105 mmol/L (ref 96–106)
Creatinine, Ser: 0.73 mg/dL (ref 0.57–1.00)
Globulin, Total: 3 g/dL (ref 1.5–4.5)
Glucose: 91 mg/dL (ref 70–99)
Potassium: 4 mmol/L (ref 3.5–5.2)
Sodium: 141 mmol/L (ref 134–144)
Total Protein: 7 g/dL (ref 6.0–8.5)
eGFR: 93 mL/min/1.73 (ref >59)

## 2024-07-23 LAB — LIPID PANEL
Chol/HDL Ratio: 4.6 ratio — ABNORMAL HIGH (ref 0.0–4.4)
Cholesterol, Total: 206 mg/dL — ABNORMAL HIGH (ref 100–199)
HDL: 45 mg/dL (ref >39)
LDL Chol Calc (NIH): 140 mg/dL — ABNORMAL HIGH (ref 0–99)
Triglycerides: 115 mg/dL (ref 0–149)
VLDL Cholesterol Cal: 21 mg/dL (ref 5–40)

## 2024-07-23 LAB — HEMOGLOBIN A1C
Est. average glucose Bld gHb Est-mCnc: 120 mg/dL
Hgb A1c MFr Bld: 5.8 % — ABNORMAL HIGH (ref 4.8–5.6)

## 2024-07-23 MED ORDER — FAMOTIDINE 20 MG PO TABS: 20.0000 mg | ORAL_TABLET | Freq: Every day | ORAL | 1 refills | Status: DC

## 2024-07-23 NOTE — Assessment & Plan Note (Signed)
 She has gained 2 lbs since April 2025.  She is encouraged to incorporate more exercise into her daily routine.   - Encouraged to strive to lose another 10 pounds before her next visit in six months. Advised to aim for at least 150 minutes of exercise per week.

## 2024-07-23 NOTE — Patient Instructions (Signed)
 Hypertension, Adult Hypertension is another name for high blood pressure. High blood pressure forces your heart to work harder to pump blood. This can cause problems over time. There are two numbers in a blood pressure reading. There is a top number (systolic) over a bottom number (diastolic). It is best to have a blood pressure that is below 120/80. What are the causes? The cause of this condition is not known. Some other conditions can lead to high blood pressure. What increases the risk? Some lifestyle factors can make you more likely to develop high blood pressure: Smoking. Not getting enough exercise or physical activity. Being overweight. Having too much fat, sugar, calories, or salt (sodium) in your diet. Drinking too much alcohol. Other risk factors include: Having any of these conditions: Heart disease. Diabetes. High cholesterol. Kidney disease. Obstructive sleep apnea. Having a family history of high blood pressure and high cholesterol. Age. The risk increases with age. Stress. What are the signs or symptoms? High blood pressure may not cause symptoms. Very high blood pressure (hypertensive crisis) may cause: Headache. Fast or uneven heartbeats (palpitations). Shortness of breath. Nosebleed. Vomiting or feeling like you may vomit (nauseous). Changes in how you see. Very bad chest pain. Feeling dizzy. Seizures. How is this treated? This condition is treated by making healthy lifestyle changes, such as: Eating healthy foods. Exercising more. Drinking less alcohol. Your doctor may prescribe medicine if lifestyle changes do not help enough and if: Your top number is above 130. Your bottom number is above 80. Your personal target blood pressure may vary. Follow these instructions at home: Eating and drinking  If told, follow the DASH eating plan. To follow this plan: Fill one half of your plate at each meal with fruits and vegetables. Fill one fourth of your plate  at each meal with whole grains. Whole grains include whole-wheat pasta, brown rice, and whole-grain bread. Eat or drink low-fat dairy products, such as skim milk or low-fat yogurt. Fill one fourth of your plate at each meal with low-fat (lean) proteins. Low-fat proteins include fish, chicken without skin, eggs, beans, and tofu. Avoid fatty meat, cured and processed meat, or chicken with skin. Avoid pre-made or processed food. Limit the amount of salt in your diet to less than 1,500 mg each day. Do not drink alcohol if: Your doctor tells you not to drink. You are pregnant, may be pregnant, or are planning to become pregnant. If you drink alcohol: Limit how much you have to: 0-1 drink a day for women. 0-2 drinks a day for men. Know how much alcohol is in your drink. In the [place] one drink equals one 12 oz bottle of beer (355 mL), one 5 oz glass of wine (148 mL), or one 1 oz glass of hard liquor (44 mL). Lifestyle  Work with your doctor to stay at a healthy weight or to lose weight. Ask your doctor what the best weight is for you. Get at least 30 minutes of exercise that causes your heart to beat faster (aerobic exercise) most days of the week. This may include walking, swimming, or biking. Get at least 30 minutes of exercise that strengthens your muscles (resistance exercise) at least 3 days a week. This may include lifting weights or doing Pilates. Do not smoke or use any products that contain nicotine or tobacco. If you need help quitting, ask your doctor. Check your blood pressure at home as told by your doctor. Keep all follow-up visits. Medicines Take over-the-counter and prescription medicines  only as told by your doctor. Follow directions carefully. Do not skip doses of blood pressure medicine. The medicine does not work as well if you skip doses. Skipping doses also puts you at risk for problems. Ask your doctor about side effects or reactions to medicines that you should watch  for. Contact a doctor if: You think you are having a reaction to the medicine you are taking. You have headaches that keep coming back. You feel dizzy. You have swelling in your ankles. You have trouble with your vision. Get help right away if: You get a very bad headache. You start to feel mixed up (confused). You feel weak or numb. You feel faint. You have very bad pain in your: Chest. Belly (abdomen). You vomit more than once. You have trouble breathing. These symptoms may be an emergency. Get help right away. Call 911. Do not wait to see if the symptoms will go away. Do not drive yourself to the hospital. Summary Hypertension is another name for high blood pressure. High blood pressure forces your heart to work harder to pump blood. For most people, a normal blood pressure is less than 120/80. Making healthy choices can help lower blood pressure. If your blood pressure does not get lower with healthy choices, you may need to take medicine. This information is not intended to replace advice given to you by your health care provider. Make sure you discuss any questions you have with your health care provider. Document Revised: 07/16/2021 Document Reviewed: 07/16/2021 Elsevier Patient Education  2024 [employer].

## 2024-07-23 NOTE — Assessment & Plan Note (Signed)
 Chronic, intermittent heartburn managed with famotidine  as needed. - Continue famotidine  as needed. - Refill famotidine  prescription.

## 2024-07-23 NOTE — Assessment & Plan Note (Signed)
 Previous labs reviewed, her A1c has been elevated in the past. I will check an A1c today. Reminded to avoid refined sugars including sugary drinks/foods and processed meats including bacon, sausages and deli meats.

## 2024-07-23 NOTE — Progress Notes (Signed)
 I,Victoria T Emmitt, CMA,acting as a Neurosurgeon for Catheryn LOISE Slocumb, MD.,have documented all relevant documentation on the behalf of Catheryn LOISE Slocumb, MD,as directed by  Catheryn LOISE Slocumb, MD while in the presence of Catheryn LOISE Slocumb, MD.  Subjective:  Patient ID: Sara Short , female    DOB: [birth year] , 63 y.o.   MRN: [id number]  Chief Complaint  Patient presents with   Hypertension    Patient presents today for a BP &  cholesterol check, patient reports compliance with medications. She denies headaches, chest pain and shortness of breath.  She reports no specific questions or concerns.    Hyperlipidemia    HPI Discussed the use of AI scribe software for clinical note transcription with the patient, who gave verbal consent to proceed.  History of Present Illness Sara Short is a 63 year old female with hypertension who presents for a blood pressure and cholesterol check.  She is currently taking amlodipine  2.5 mg, simvastatin  20 mg, and telmisartan  hydrochlorothiazide 80/25 mg. She has gained 2 pounds over the past six months, now weighing 242 pounds, and aims to reduce her weight to under 220 pounds. Regular exercise is part of her routine, but she acknowledges a need to increase her water intake, especially at home where she tends to drink more milk.  She uses famotidine  as needed for reflux and prefers to keep it on hand. She denies having any recent symptoms. She has not received a flu shot or shingles shot.  She mentions a previous issue with her ankles, for which she received a shot in each ankle last month or the month before.   Hypertension This is a chronic problem. The current episode started more than 1 year ago. The problem has been gradually improving since onset. The problem is uncontrolled. Pertinent negatives include no blurred vision, chest pain, palpitations or shortness of breath. Risk factors for coronary artery disease include obesity, post-menopausal  state and sedentary lifestyle. The current treatment provides moderate improvement. Compliance problems include exercise.      Past Medical History:  Diagnosis Date   Allergic rhinitis    HTN (hypertension)    Obesity      Family History  Problem Relation Age of Onset   Diverticulitis Mother    Hypertension Mother    Diabetes Father      Current Outpatient Medications:    amLODipine  (NORVASC ) 2.5 MG tablet, TAKE 1 TABLET(2.5 MG) BY MOUTH DAILY AT BEDTIME, Disp: 90 tablet, Rfl: 2   Chlorpheniramine Maleate (ALLERGY PO), Take by mouth., Disp: , Rfl:    Cholecalciferol (VITAMIN D3) 25 MCG (1000 UT) CAPS, Take 2 capsules (2,000 Units total) by mouth daily. 1 per day, Disp: 60 capsule, Rfl: 1   Magnesium  400 MG CAPS, Take 400 mg by mouth Nightly., Disp: 90 capsule, Rfl: 1   Multiple Vitamin (MULTIVITAMIN) capsule, Take 1 capsule by mouth daily., Disp: , Rfl:    nystatin  cream (MYCOSTATIN ), Apply topically 2 (two) times daily as needed for dry skin (intertrigo). APPLY TOPICALLY TO THE AFFECTED AREA TWICE DAILY as needed, Disp: 30 g, Rfl: 1   simvastatin  (ZOCOR ) 20 MG tablet, TAKE 1 TABLET(20 MG) BY MOUTH AT BEDTIME, Disp: 90 tablet, Rfl: 2   telmisartan -hydrochlorothiazide (MICARDIS  HCT) 80-25 MG tablet, Take 1 tablet by mouth daily., Disp: 90 tablet, Rfl: 2   famotidine  (PEPCID ) 20 MG tablet, TAKE 1 TABLET(20 MG) BY MOUTH DAILY AS NEEDED, Disp: 90 tablet, Rfl: 2   methocarbamol  (ROBAXIN ) 500 MG  tablet, Take 1 tablet (500 mg total) by mouth every 6 (six) hours as needed for muscle spasms. (Patient not taking: Reported on 07/23/2024), Disp: 20 tablet, Rfl: 0   nitroGLYCERIN  (NITRODUR - DOSED IN MG/24 HR) 0.2 mg/hr patch, Place 1/4 patch on affected area of achilles. (Patient not taking: Reported on 07/23/2024), Disp: 30 patch, Rfl: 0   Allergies  Allergen Reactions   Crestor [Rosuvastatin Calcium] Hives   Lipitor [Atorvastatin Calcium] Hives     Review of Systems  Constitutional:  Negative.   Eyes:  Negative for blurred vision.  Respiratory: Negative.  Negative for shortness of breath.   Cardiovascular: Negative.  Negative for chest pain and palpitations.  Gastrointestinal: Negative.   Musculoskeletal:  Positive for arthralgias.       She has b/l ankle pain. Followed by Ortho  Neurological: Negative.   Psychiatric/Behavioral: Negative.       Today's Vitals   07/23/24 1021  BP: 128/82  Pulse: 80  Temp: 98.4 F (36.9 C)  SpO2: 98%  Weight: 242 lb (109.8 kg)  Height: 5' 7 (1.702 m)   Body mass index is 37.9 kg/m.  Wt Readings from Last 3 Encounters:  07/23/24 242 lb (109.8 kg)  01/23/24 240 lb (108.9 kg)  06/14/23 242 lb (109.8 kg)     Objective:  Physical Exam Vitals and nursing note reviewed.  Constitutional:      Appearance: Normal appearance. She is obese.  HENT:     Head: Normocephalic and atraumatic.  Eyes:     Extraocular Movements: Extraocular movements intact.  Cardiovascular:     Rate and Rhythm: Normal rate and regular rhythm.     Heart sounds: Normal heart sounds.  Pulmonary:     Effort: Pulmonary effort is normal.     Breath sounds: Normal breath sounds.  Musculoskeletal:     Cervical back: Normal range of motion.  Skin:    General: Skin is warm.  Neurological:     General: No focal deficit present.     Mental Status: She is alert.  Psychiatric:        Mood and Affect: Mood normal.        Behavior: Behavior normal.         Assessment And Plan:  Essential hypertension, benign Assessment & Plan: Chronic, fair control. Goal BP<120/80.  Blood pressure slightly above target.  - Encourage regular exercise. - Advise reducing salt intake. - Continue with amlodipine  2.5mg  daily - Continue with telmisartan /hct 80/25mg  daily  Orders: -     CMP14+EGFR -     Lipid panel  Pure hypercholesterolemia Assessment & Plan: Cholesterol management with simvastatin  20 mg is ongoing.   - Continue simvastatin  20 mg daily. - She is  encouraged to lead a heart healthy lifestyle.   Orders: -     CMP14+EGFR -     Lipid panel  Gastroesophageal reflux disease without esophagitis Assessment & Plan: Chronic, intermittent heartburn managed with famotidine  as needed. - Continue famotidine  as needed. - Refill famotidine  prescription.   Prediabetes Assessment & Plan: Previous labs reviewed, her A1c has been elevated in the past. I will check an A1c today. Reminded to avoid refined sugars including sugary drinks/foods and processed meats including bacon, sausages and deli meats.    Orders: -     CMP14+EGFR -     Hemoglobin A1c  Class 2 severe obesity due to excess calories with serious comorbidity and body mass index (BMI) of 37.0 to 37.9 in adult Assessment & Plan: She  has gained 2 lbs since April 2025.  She is encouraged to incorporate more exercise into her daily routine.   - Encouraged to strive to lose another 10 pounds before her next visit in six months. Advised to aim for at least 150 minutes of exercise per week.     Return if symptoms worsen or fail to improve.  Patient was given opportunity to ask questions. Patient verbalized understanding of the plan and was able to repeat key elements of the plan. All questions were answered to their satisfaction.   I, Catheryn LOISE Slocumb, MD, have reviewed all documentation for this visit. The documentation on 07/23/24 for the exam, diagnosis, procedures, and orders are all accurate and complete.   IF YOU HAVE BEEN REFERRED TO A SPECIALIST, IT MAY TAKE 1-2 WEEKS TO SCHEDULE/PROCESS THE REFERRAL. IF YOU HAVE NOT HEARD FROM US /SPECIALIST IN TWO WEEKS, PLEASE GIVE US  A CALL AT [phone].   THE PATIENT IS ENCOURAGED TO PRACTICE SOCIAL DISTANCING DUE TO THE COVID-19 PANDEMIC.

## 2024-07-23 NOTE — Assessment & Plan Note (Addendum)
 Chronic, fair control. Goal BP<120/80.  Blood pressure slightly above target.  - Encourage regular exercise. - Advise reducing salt intake. - Continue with amlodipine  2.5mg  daily - Continue with telmisartan /hct 80/25mg  daily

## 2024-07-23 NOTE — Assessment & Plan Note (Signed)
 Cholesterol management with simvastatin  20 mg is ongoing.   - Continue simvastatin  20 mg daily. - She is encouraged to lead a heart healthy lifestyle.

## 2024-07-30 ENCOUNTER — Ambulatory Visit: Payer: Self-pay | Admitting: Internal Medicine

## 2024-08-13 ENCOUNTER — Encounter: Payer: Self-pay | Admitting: Radiology

## 2024-08-24 ENCOUNTER — Other Ambulatory Visit: Payer: Self-pay | Admitting: Internal Medicine

## 2024-10-18 ENCOUNTER — Other Ambulatory Visit: Payer: Self-pay | Admitting: Internal Medicine

## 2024-10-18 ENCOUNTER — Other Ambulatory Visit: Payer: Self-pay | Admitting: Sports Medicine

## 2024-10-18 DIAGNOSIS — Z1231 Encounter for screening mammogram for malignant neoplasm of breast: Secondary | ICD-10-CM

## 2024-10-18 DIAGNOSIS — M7662 Achilles tendinitis, left leg: Secondary | ICD-10-CM

## 2024-10-30 ENCOUNTER — Encounter: Payer: Self-pay | Admitting: Internal Medicine

## 2024-12-10 ENCOUNTER — Ambulatory Visit

## 2025-01-24 ENCOUNTER — Encounter: Admitting: Internal Medicine

## 2025-02-04 ENCOUNTER — Encounter: Payer: Self-pay | Admitting: Internal Medicine
